# Patient Record
Sex: Male | Born: 1965 | Race: White | Hispanic: No | Marital: Married | Smoking: Current every day smoker
Health system: Southern US, Community
[De-identification: ages and names within clinical notes are randomized; demographics above are authoritative.]

## PROBLEM LIST (undated history)

## (undated) DIAGNOSIS — B182 Chronic viral hepatitis C: Secondary | ICD-10-CM

## (undated) DIAGNOSIS — K7460 Unspecified cirrhosis of liver: Secondary | ICD-10-CM

## (undated) DIAGNOSIS — L409 Psoriasis, unspecified: Secondary | ICD-10-CM

## (undated) DIAGNOSIS — L309 Dermatitis, unspecified: Secondary | ICD-10-CM

## (undated) DIAGNOSIS — F1020 Alcohol dependence, uncomplicated: Secondary | ICD-10-CM

## (undated) DIAGNOSIS — F17200 Nicotine dependence, unspecified, uncomplicated: Secondary | ICD-10-CM

## (undated) DIAGNOSIS — I10 Essential (primary) hypertension: Secondary | ICD-10-CM

---

## 2007-06-15 ENCOUNTER — Emergency Department: Payer: Self-pay | Admitting: Unknown Physician Specialty

## 2014-11-28 ENCOUNTER — Other Ambulatory Visit: Payer: Self-pay | Admitting: Gastroenterology

## 2014-11-28 DIAGNOSIS — B182 Chronic viral hepatitis C: Secondary | ICD-10-CM

## 2014-12-04 ENCOUNTER — Ambulatory Visit: Payer: No Typology Code available for payment source

## 2021-07-20 ENCOUNTER — Inpatient Hospital Stay
Admission: EM | Admit: 2021-07-20 | Discharge: 2021-07-23 | DRG: 433 | Disposition: A | Discharge status: Home / Self Care | Payer: 59 | Attending: Internal Medicine | Admitting: Internal Medicine

## 2021-07-20 ENCOUNTER — Emergency Department: Payer: 59

## 2021-07-20 ENCOUNTER — Other Ambulatory Visit: Payer: Self-pay

## 2021-07-20 DIAGNOSIS — R1900 Intra-abdominal and pelvic swelling, mass and lump, unspecified site: Secondary | ICD-10-CM | POA: Diagnosis not present

## 2021-07-20 DIAGNOSIS — Z6841 Body Mass Index (BMI) 40.0 and over, adult: Secondary | ICD-10-CM

## 2021-07-20 DIAGNOSIS — F1020 Alcohol dependence, uncomplicated: Secondary | ICD-10-CM

## 2021-07-20 DIAGNOSIS — K5900 Constipation, unspecified: Secondary | ICD-10-CM | POA: Diagnosis present

## 2021-07-20 DIAGNOSIS — Z79899 Other long term (current) drug therapy: Secondary | ICD-10-CM

## 2021-07-20 DIAGNOSIS — E669 Obesity, unspecified: Secondary | ICD-10-CM | POA: Diagnosis present

## 2021-07-20 DIAGNOSIS — F17210 Nicotine dependence, cigarettes, uncomplicated: Secondary | ICD-10-CM | POA: Diagnosis present

## 2021-07-20 DIAGNOSIS — R188 Other ascites: Secondary | ICD-10-CM

## 2021-07-20 DIAGNOSIS — I10 Essential (primary) hypertension: Secondary | ICD-10-CM

## 2021-07-20 DIAGNOSIS — Z28310 Unvaccinated for covid-19: Secondary | ICD-10-CM

## 2021-07-20 DIAGNOSIS — K7031 Alcoholic cirrhosis of liver with ascites: Secondary | ICD-10-CM | POA: Diagnosis not present

## 2021-07-20 DIAGNOSIS — Z66 Do not resuscitate: Secondary | ICD-10-CM | POA: Diagnosis present

## 2021-07-20 DIAGNOSIS — K7011 Alcoholic hepatitis with ascites: Secondary | ICD-10-CM | POA: Diagnosis present

## 2021-07-20 DIAGNOSIS — Z8249 Family history of ischemic heart disease and other diseases of the circulatory system: Secondary | ICD-10-CM

## 2021-07-20 DIAGNOSIS — B182 Chronic viral hepatitis C: Secondary | ICD-10-CM

## 2021-07-20 DIAGNOSIS — R7401 Elevation of levels of liver transaminase levels: Secondary | ICD-10-CM | POA: Diagnosis present

## 2021-07-20 DIAGNOSIS — Z20822 Contact with and (suspected) exposure to covid-19: Secondary | ICD-10-CM | POA: Diagnosis present

## 2021-07-20 DIAGNOSIS — E876 Hypokalemia: Secondary | ICD-10-CM | POA: Diagnosis present

## 2021-07-20 HISTORY — DX: Chronic viral hepatitis C: B18.2

## 2021-07-20 HISTORY — DX: Nicotine dependence, unspecified, uncomplicated: F17.200

## 2021-07-20 HISTORY — DX: Alcohol dependence, uncomplicated: F10.20

## 2021-07-20 HISTORY — DX: Essential (primary) hypertension: I10

## 2021-07-20 LAB — MAGNESIUM
Magnesium: 1.9 mg/dL (ref 1.7–2.4)
Magnesium: 1.9 mg/dL (ref 1.7–2.4)

## 2021-07-20 LAB — COMPREHENSIVE METABOLIC PANEL
ALT: 30 U/L (ref 0–44)
AST: 69 U/L — ABNORMAL HIGH (ref 15–41)
Albumin: 2.7 g/dL — ABNORMAL LOW (ref 3.5–5.0)
Alkaline Phosphatase: 139 U/L — ABNORMAL HIGH (ref 38–126)
Anion gap: 6 (ref 5–15)
BUN: 8 mg/dL (ref 6–20)
CO2: 24 mmol/L (ref 22–32)
Calcium: 8.3 mg/dL — ABNORMAL LOW (ref 8.9–10.3)
Chloride: 102 mmol/L (ref 98–111)
Creatinine, Ser: 0.64 mg/dL (ref 0.61–1.24)
GFR, Estimated: >60 mL/min (ref >60)
Glucose, Bld: 121 mg/dL — ABNORMAL HIGH (ref 70–99)
Potassium: 2.7 mmol/L — CL (ref 3.5–5.1)
Sodium: 132 mmol/L — ABNORMAL LOW (ref 135–145)
Total Bilirubin: 1.6 mg/dL — ABNORMAL HIGH (ref 0.3–1.2)
Total Protein: 8.3 g/dL — ABNORMAL HIGH (ref 6.5–8.1)

## 2021-07-20 LAB — CBC
HCT: 39.9 % (ref 39.0–52.0)
Hemoglobin: 13.5 g/dL (ref 13.0–17.0)
MCH: 32.8 pg (ref 26.0–34.0)
MCHC: 33.8 g/dL (ref 30.0–36.0)
MCV: 96.8 fL (ref 80.0–100.0)
Platelets: 193 10*3/uL (ref 150–400)
RBC: 4.12 MIL/uL — ABNORMAL LOW (ref 4.22–5.81)
RDW: 14.4 % (ref 11.5–15.5)
WBC: 8.9 10*3/uL (ref 4.0–10.5)
nRBC: 0 % (ref 0.0–0.2)

## 2021-07-20 LAB — AMMONIA: Ammonia: 65 umol/L — ABNORMAL HIGH (ref 9–35)

## 2021-07-20 LAB — RESP PANEL BY RT-PCR (FLU A&B, COVID) ARPGX2
Influenza A by PCR: NEGATIVE
Influenza B by PCR: NEGATIVE
SARS Coronavirus 2 by RT PCR: NEGATIVE

## 2021-07-20 LAB — LIPASE, BLOOD: Lipase: 57 U/L — ABNORMAL HIGH (ref 11–51)

## 2021-07-20 LAB — PROTIME-INR
INR: 1.1 (ref 0.8–1.2)
Prothrombin Time: 14.7 seconds (ref 11.4–15.2)

## 2021-07-20 LAB — PHOSPHORUS: Phosphorus: 3.4 mg/dL (ref 2.5–4.6)

## 2021-07-20 MED ORDER — ALBUTEROL SULFATE (2.5 MG/3ML) 0.083% IN NEBU: 2.5000 mg | INHALATION_SOLUTION | RESPIRATORY_TRACT | Status: DC | PRN

## 2021-07-20 MED ORDER — POTASSIUM CITRATE-CITRIC ACID 1100-334 MG/5ML PO SOLN
40.0000 meq | Freq: Once | ORAL | Status: AC
  Administered 2021-07-21: 40 meq via ORAL
  Filled 2021-07-20: qty 20

## 2021-07-20 MED ORDER — MORPHINE SULFATE (PF) 4 MG/ML IV SOLN
4.0000 mg | INTRAVENOUS | Status: DC | PRN
  Administered 2021-07-20: 19:00:00 4 mg via INTRAVENOUS
  Filled 2021-07-20: qty 1

## 2021-07-20 MED ORDER — LORAZEPAM 2 MG/ML IJ SOLN: 0.0000 mg | Freq: Two times a day (BID) | INTRAMUSCULAR | Status: DC

## 2021-07-20 MED ORDER — SODIUM CHLORIDE 0.9 % IV SOLN: INTRAVENOUS | Status: DC | PRN

## 2021-07-20 MED ORDER — ESCITALOPRAM OXALATE 10 MG PO TABS
10.0000 mg | ORAL_TABLET | Freq: Every day | ORAL | Status: DC
  Administered 2021-07-21 – 2021-07-23 (×3): 10 mg via ORAL
  Filled 2021-07-20 (×4): qty 1

## 2021-07-20 MED ORDER — LACTULOSE 10 GM/15ML PO SOLN
30.0000 g | Freq: Once | ORAL | Status: AC
  Administered 2021-07-20: 20:00:00 30 g via ORAL
  Filled 2021-07-20: qty 60

## 2021-07-20 MED ORDER — LORAZEPAM 2 MG PO TABS: 0.0000 mg | ORAL_TABLET | Freq: Two times a day (BID) | ORAL | Status: DC

## 2021-07-20 MED ORDER — ACETAMINOPHEN 650 MG RE SUPP
650.0000 mg | Freq: Four times a day (QID) | RECTAL | Status: DC | PRN
  Filled 2021-07-20: qty 1

## 2021-07-20 MED ORDER — THIAMINE HCL 100 MG/ML IJ SOLN
100.0000 mg | Freq: Every day | INTRAMUSCULAR | Status: DC
  Administered 2021-07-20: 18:00:00 100 mg via INTRAVENOUS
  Filled 2021-07-20: qty 2

## 2021-07-20 MED ORDER — MORPHINE SULFATE (PF) 2 MG/ML IV SOLN
2.0000 mg | INTRAVENOUS | Status: AC | PRN
  Administered 2021-07-21 (×3): 2 mg via INTRAVENOUS
  Filled 2021-07-20 (×3): qty 1

## 2021-07-20 MED ORDER — APREMILAST 30 MG PO TABS
30.0000 mg | ORAL_TABLET | Freq: Two times a day (BID) | ORAL | Status: DC
  Administered 2021-07-21 – 2021-07-23 (×4): 30 mg via ORAL
  Filled 2021-07-20 (×5): qty 1

## 2021-07-20 MED ORDER — ALBUMIN HUMAN 25 % IV SOLN
25.0000 g | INTRAVENOUS | Status: DC | PRN
  Filled 2021-07-20: qty 100

## 2021-07-20 MED ORDER — POTASSIUM CHLORIDE 10 MEQ/100ML IV SOLN
10.0000 meq | INTRAVENOUS | Status: AC
  Administered 2021-07-20 (×4): 10 meq via INTRAVENOUS
  Filled 2021-07-20 (×2): qty 100

## 2021-07-20 MED ORDER — LORAZEPAM 2 MG/ML IJ SOLN: 0.0000 mg | Freq: Four times a day (QID) | INTRAMUSCULAR | Status: AC

## 2021-07-20 MED ORDER — ALBUTEROL SULFATE HFA 108 (90 BASE) MCG/ACT IN AERS: 2.0000 | INHALATION_SPRAY | RESPIRATORY_TRACT | Status: DC | PRN

## 2021-07-20 MED ORDER — HEPARIN SODIUM (PORCINE) 5000 UNIT/ML IJ SOLN
5000.0000 [IU] | Freq: Three times a day (TID) | INTRAMUSCULAR | Status: AC
  Administered 2021-07-20: 21:00:00 5000 [IU] via SUBCUTANEOUS
  Filled 2021-07-20: qty 1

## 2021-07-20 MED ORDER — ONDANSETRON HCL 4 MG PO TABS: 4.0000 mg | ORAL_TABLET | Freq: Four times a day (QID) | ORAL | Status: DC | PRN

## 2021-07-20 MED ORDER — ADULT MULTIVITAMIN W/MINERALS CH
1.0000 | ORAL_TABLET | Freq: Every day | ORAL | Status: DC
  Administered 2021-07-20 – 2021-07-23 (×4): 1 via ORAL
  Filled 2021-07-20 (×4): qty 1

## 2021-07-20 MED ORDER — ACETAMINOPHEN 325 MG PO TABS
650.0000 mg | ORAL_TABLET | Freq: Four times a day (QID) | ORAL | Status: DC | PRN
  Administered 2021-07-23: 12:00:00 650 mg via ORAL
  Filled 2021-07-20: qty 2

## 2021-07-20 MED ORDER — THIAMINE HCL 100 MG PO TABS
100.0000 mg | ORAL_TABLET | Freq: Every day | ORAL | Status: DC
  Administered 2021-07-21 – 2021-07-23 (×3): 100 mg via ORAL
  Filled 2021-07-20 (×3): qty 1

## 2021-07-20 MED ORDER — AMLODIPINE BESYLATE 5 MG PO TABS: 10.0000 mg | ORAL_TABLET | Freq: Every day | ORAL | Status: DC

## 2021-07-20 MED ORDER — ONDANSETRON HCL 4 MG/2ML IJ SOLN
4.0000 mg | Freq: Once | INTRAMUSCULAR | Status: AC
  Administered 2021-07-20: 19:00:00 4 mg via INTRAVENOUS
  Filled 2021-07-20: qty 2

## 2021-07-20 MED ORDER — LORAZEPAM 2 MG PO TABS: 0.0000 mg | ORAL_TABLET | Freq: Four times a day (QID) | ORAL | Status: AC

## 2021-07-20 MED ORDER — LABETALOL HCL 5 MG/ML IV SOLN: 5.0000 mg | INTRAVENOUS | Status: AC | PRN

## 2021-07-20 MED ORDER — ONDANSETRON HCL 4 MG/2ML IJ SOLN: 4.0000 mg | Freq: Four times a day (QID) | INTRAMUSCULAR | Status: DC | PRN

## 2021-07-20 NOTE — H&P (Addendum)
°History and Physical  ° °Cameron Shelton MRN:[id number] DOB: 12/16/1965 DOA: 07/20/2021 ° °PCP: Bliss, Laura K, MD °Outpatient Specialists: Dr. Matthew Rein, gastroenterologist, for care everywhere review, last seen in 2016 °Patient coming from: Home ° °I have personally briefly reviewed patient's old medical records in [facility]. ° °Chief Concern: Abdominal pain ° °HPI: Cameron Shelton is a 55 y.o. male with medical history significant for alcohol use disorder, history of chronic hepatitis C, lost to follow-up, obesity, tobacco use disorder, who presents to the emergency department from home for chief concerns of abdominal pain and distention. ° °At bedside Cameron Shelton is able to tell me his name, age, current location and current calendar year.  He does not appear to be in acute distress.  Physical exam was negative for asterixis. ° °Patient reports that the abdominal discomfort and distention has been ongoing and worsening for the last 3 weeks.  He reports that his feet started swelling first.  He reports that his abdomen started having distention over the last 3 weeks.  He states he probably gained approximately 30 pounds.  He denies any fever, nausea, vomiting, chest pain.  He denies any dysuria, diarrhea.  He endorses constipation.  He reports that his last bowel movement was day of admission, and it was hard stool.  He endorses poor appetite and increased constipation from baseline.  He also endorses that his eyes have turned yellow.  He endorses shortness of breath with exertion.  He endorses abdominal generalized pain and discomfort. ° °He denies history of tremors, seizures when he does not have alcohol.  He states he does not drink every day, but nearly every day. ° °Social history: He lives at home with his wife. He smokes cigarettes, 1/2 ppd and at his peak, he smoked 1 ppd. He drinks about 4-5 (12oz) beers per day, for 30 years. 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 ° °Vaccination history: He  is not vaccinated for covid 19.  ° °ROS: °Constitutional: + weight change, no fever °ENT/Mouth: no sore throat, no rhinorrhea °Eyes: no eye pain, no vision changes °Cardiovascular: no chest pain, no dyspnea,  + edema, no palpitations °Respiratory: no cough, no sputum, no wheezing °Gastrointestinal: no nausea, no vomiting, no diarrhea, no constipation °Genitourinary: no urinary incontinence, no dysuria, no hematuria °Musculoskeletal: no arthralgias, no myalgias °Skin: no skin lesions, no pruritus, °Neuro: + weakness, no loss of consciousness, no syncope °Psych: no anxiety, no depression, + decrease appetite °Heme/Lymph: no bruising, no bleeding ° °ED Course: Discussed with emergency medicine provider, patient requiring hospitalization for chief concerns of new onset abdominal ascites presumed secondary to alcohol liver cirrhosis. ° °Vitals in the emergency department showed temperature of 98.1, respiration rate of 19, heart rate of 78, blood pressure 120/80, SPO2 of 99% on room air. ° °Labs in the emergency department was remarkable for serum sodium 132, potassium 2.7, chloride 102, bicarb of 24, BUN of 8, serum creatinine of 0.64, nonfasting blood glucose 121, GFR of 60.  WBC was 8.9, hemoglobin 13.5, platelets 193.  Alk phos was elevated at 139 albumin was low at 2.7. ° °Lipase was mildly elevated at 57.  AST was elevated at 6.9, ALT is 30.  Ammonia was elevated at 65.  T bili was 1.6. ° °COVID/influenza A/influenza B PCR were negative. ° °In the emergency department patient received lactulose 30 g, potassium 10 mill equivalent by IV, 4 doses ordered. ° °Patient was also given morphine 4 mg IV. ° °Assessment/Plan ° °  Principal Problem: °  Ascites °Active Problems: °  Alcoholic cirrhosis of liver with ascites (HCC) °  Chronic hepatitis C without hepatic coma (HCC) °  Alcohol use disorder, severe, dependence (HCC) °  Hypokalemia °  Transaminitis °  Constipation °  Essential hypertension °  °# Abdominal ascites  presumed secondary to new diagnosis of alcoholic liver cirrhosis versus chronic hepatitis C liver cirrhosis °- IR paracentesis ordered with labs °- As needed albumin, 2 doses, ordered for hypotension during paracentesis °- Patient would benefit from regular follow-up with outpatient GI Dr. °- Would recommend patient to reestablish care with Dr. Matthew Rein °- Admit to telemetry medical floor, observation ° °# New diagnosis of cirrhotic liver-status post lactulose 30 g, 1 dose per EDP ° °# Hypokalemia-check magnesium level stat °- Continue IV potassium supplementation °- Potassium Polycitra 40 mill equivalent one-time dose ordered °- Ordered stat EKG ° °# Alcohol use disorder-check phosphorus level stat °- Patient's last drink was half of beer on 07/19/2021 °- Continue CIWA protocol per EDP order °- Extensive counseling given for alcohol cessation °- Patient endorses that he will stop drinking for now ° °# History of hypertension - currently normotensive to low normotensive °- Holding home amlodipine 10 mg daily at this time °- Labetalol 5 mg IV q2h prn for SBP > 170, 1 day ordered °- AM team to resume antihypertensive pending patient clinical course ° °# Chronic hepatitis C-I asked patient why he did not continue follow-up with his gastroenterologist, he had no answer for me he states he just did not go °- Extensive discussion with patient that he will need follow-up with a gastroenterologist in order to prevent reoccurring ascites °- He endorsed to me understanding and compliance ° °# Tobacco use disorder-counseling given °- Nicotine patch was offered, patient declined ° °# DVT prophylaxis-I have ordered 1 dose of heparin 5000 units for DVT prophylaxis °- A.m. team to order more pharmacologic DVT prophylaxis after paracentesis ° °# CODE STATUS-extensive discussion with patient at bedside.  Patient states that he saw his dad go through resuscitation and intubation and he did not want that for himself °- He did not  want chest compression and he did not want intubation. ° °Chart reviewed.  ° °11/27/2014: Patient had initial consultation with gastroenterologist for chronic hepatitis C without hepatic coma.  Liver elastography with ultrasound to rule out cirrhosis was ordered.  He was advised to stop EtOH and tobacco use.  It was noted that the assessment and plan was to apply for 12 weeks of Harvoni.  Patient declined hep a and hep B vaccine. ° °DVT prophylaxis: 1 dose of heparin 5000 units subcutaneous; TED hose °Code Status: DNR/DNI °Diet: Heart healthy °Family Communication: No °Disposition Plan: Pending clinical course °Consults called: None at this time °Admission status: Telemetry medical, observation ° °Past Medical History:  °Diagnosis Date  ° Alcohol use disorder, severe, dependence (HCC)   ° Hepatitis C, chronic (HCC)   ° Hypertension   ° °History reviewed. No pertinent surgical history. ° °Social History:  reports current alcohol use. He reports that he does not currently use drugs. No history on file for tobacco use. ° °No Known Allergies °Family History  °Problem Relation Age of Onset  ° Hypertension Father   ° °Family history: Family history reviewed and not pertinent ° °Prior to Admission medications   °Medication Sig Start Date End Date Taking? Authorizing Provider  °albuterol (VENTOLIN HFA) 108 (90 Base) MCG/ACT inhaler Inhale 2 puffs into the lungs every 4 (  four) hours as needed for shortness of breath or wheezing.   Yes [provider]  amLODipine (NORVASC) 10 MG tablet Take 10 mg by mouth daily.   Yes [provider]  Apremilast (OTEZLA) 30 MG TABS Take 30 mg by mouth 2 (two) times daily.   Yes [provider]  chlorthalidone (HYGROTON) 25 MG tablet Take 25 mg by mouth daily.   Yes [provider]  escitalopram (LEXAPRO) 10 MG tablet Take 10 mg by mouth daily.   Yes [provider]  HYDROcodone-acetaminophen (NORCO) 10-325 MG tablet Take 1 tablet by mouth 4  (four) times daily as needed for pain.   Yes [provider]  Multiple Vitamins-Minerals (MULTIVITAMIN WITH MINERALS) tablet Take 1 tablet by mouth daily.   Yes [provider]  potassium chloride SA (KLOR-CON M) 20 MEQ tablet Take 20 mEq by mouth daily.   Yes [provider]   Physical Exam: Vitals:   07/20/21 1800 07/20/21 1830 07/20/21 1900 07/20/21 1930  BP: 120/80 (!) 146/87 123/78 121/80  Pulse: 76 73 77 77  Resp:  _0 Temp:      TempSrc:      SpO2:  97% 94% 96%  Weight:      Height:       Constitutional: appears older than chronological age, frail, NAD, calm, comfortable Eyes: PERRL, lids and conjunctivae normal ENMT: Mucous membranes are moist. Posterior pharynx clear of any exudate or lesions. Age-appropriate dentition. Hearing appropriate Neck: normal, supple, no masses, no thyromegaly Respiratory: clear to auscultation bilaterally, no wheezing, no crackles. Normal respiratory effort. No accessory muscle use.  Cardiovascular: Regular rate and rhythm, no murmurs / rubs / gallops.  Bilateral lower extremity edema. 2+ pedal pulses. No carotid bruits.  Abdomen: Distended abdomen, + diffuse tenderness, no masses palpated, no hepatosplenomegaly. Bowel sounds positive.  Musculoskeletal: no clubbing / cyanosis. No joint deformity upper and lower extremities. Good ROM, no contractures, no atrophy. Normal muscle tone.  Skin: no rashes, lesions, ulcers. No induration Neurologic: Sensation intact. Strength 5/5 in all 4.  Psychiatric: Normal judgment and insight. Alert and oriented x 3.  Flat affect.  EKG: independently reviewed, showing sinus rhythm, low amplitude, with a rate of 78, QTc 506  Chest x-ray on Admission: I personally reviewed and I agree with radiologist reading as below.  DG Chest Portable 1 View  Result Date: 07/20/2021 CLINICAL DATA:  Edema. EXAM: PORTABLE CHEST 1 VIEW COMPARISON:  None. FINDINGS: The heart size and mediastinal  contours are within normal limits. Both lungs are clear. The visualized skeletal structures are unremarkable. IMPRESSION: No active disease. Electronically Signed   By: Dorise Bullion III M.D.   On: 07/20/2021 18:15   US ABDOMEN LIMITED RUQ (LIVER/GB)  Result Date: 07/20/2021 CLINICAL DATA:  Upper abdominal pain and nausea for 2 weeks. EXAM: ULTRASOUND ABDOMEN LIMITED RIGHT UPPER QUADRANT COMPARISON:  None. FINDINGS: Gallbladder: No gallstones visualized. There is mild gallbladder wall thickening measuring 0.4 cm. Negative sonographic Murphy sign. Common bile duct: Diameter: 0.6 cm, within normal limits Liver: The liver is nodular and shrunken. No focal lesion identified.Portal vein is patent on color Doppler imaging with normal direction of blood flow towards the liver. Other: Moderate 4 quadrant ascites. IMPRESSION: 1.  Cirrhotic appearance of the liver.  No focal lesion identified. 2. Nonspecific mild gallbladder wall thickening. No gallstones identified. 3.  Moderate 4 quadrant ascites. Electronically Signed   By: Audie Pinto M.D.   On: 07/20/2021 18:57  Labs on Admission: I have personally reviewed following labs ° °CBC: °Recent Labs  °Lab 07/20/21 °1744  °WBC 8.9  °HGB 13.5  °HCT 39.9  °MCV 96.8  °PLT 193  ° °Basic Metabolic Panel: °Recent Labs  °Lab 07/20/21 °1744  °NA 132*  °K 2.7*  °CL 102  °CO2 24  °GLUCOSE 121*  °BUN 8  °CREATININE 0.64  °CALCIUM 8.3*  °MG 1.9  ° °GFR: °Estimated Creatinine Clearance: 120.5 mL/min (by C-G formula based on SCr of 0.64 mg/dL). ° °Liver Function Tests: °Recent Labs  °Lab 07/20/21 °1744  °AST 69*  °ALT 30  °ALKPHOS 139*  °BILITOT 1.6*  °PROT 8.3*  °ALBUMIN 2.7*  ° °Recent Labs  °Lab 07/20/21 °1744  °LIPASE 57*  ° °Recent Labs  °Lab 07/20/21 °1744  °AMMONIA 65*  ° °Coagulation Profile: °Recent Labs  °Lab 07/20/21 °1744  °INR 1.1  ° °Dr. Cox °[facility] ° °If 7PM-7AM, please contact overnight-coverage provider °If 7AM-7PM, please contact day coverage  provider °[email or web address] ° °07/20/2021, 9:34 PM  ° °

## 2021-07-20 NOTE — ED Triage Notes (Signed)
Pt states he has has upper abdominal pain for 2 weeks- pt has had nausea with dry heaving, no appetite, more constipated than normal, and increased fatigue- pt abdomen distended and pt states it feels tight- pt eyes are yellow

## 2021-07-20 NOTE — ED Notes (Signed)
Pt is c/o of general abdominal pain that has been ongoing for a couple a weeks. Pt also c/o of nausea with no vomiting and decrease in appetite. Pt states he drinks 3 times daily. Pain level is 8.

## 2021-07-20 NOTE — ED Provider Notes (Signed)
[facility] Emergency Department Provider Note    Event Date/Time   First MD Initiated Contact with Patient 07/20/21 1719     (approximate)  I have reviewed the triage vital signs and the nursing notes.   HISTORY  Chief Complaint Abdominal Pain    HPI Cameron Shelton is a 55 y.o. male history of alcohol use as well as reported elevated liver enzymes but no formal diagnosis of cirrhosis presents to the ER for evaluation of abdominal pain distention and swelling.  This is been going on for several weeks but become significantly more noticeable in the past 24 hours.  Denies any shortness of breath.  Has not been able to eat or drink due to discomfort.  Having trouble moving his bowels as well.  Past Medical History:  Diagnosis Date   Hypertension    No family history on file.  There are no problems to display for this patient.     Prior to Admission medications   Not on File    Allergies Patient has no known allergies.    Social History    Review of Systems Patient denies headaches, rhinorrhea, blurry vision, numbness, shortness of breath, chest pain, edema, cough, abdominal pain, nausea, vomiting, diarrhea, dysuria, fevers, rashes or hallucinations unless otherwise stated above in HPI. ____________________________________________   PHYSICAL EXAM:  VITAL SIGNS: Vitals:   07/20/21 1800 07/20/21 1830  BP: 120/80 (!) 146/87  Pulse: 76 73  Resp:  18  Temp:    SpO2:  97%    Constitutional: Alert and oriented.  Eyes: Conjunctivae are normal.  Head: Atraumatic. Nose: No congestion/rhinnorhea. Mouth/Throat: Mucous membranes are moist.   Neck: No stridor. Painless ROM.  Cardiovascular: Normal rate, regular rhythm. Grossly normal heart sounds.  Good peripheral circulation. Respiratory: Normal respiratory effort.  No retractions. Lungs CTAB. Gastrointestinal: Diffusely distended with positive fluid wave.  No guarding or rebound  tenderness   Genitourinary:  Musculoskeletal: No lower extremity tenderness, 2+ BLE edema.  No joint effusions. Neurologic:  Normal speech and language. No gross focal neurologic deficits are appreciated. No facial droop Skin:  Skin is warm, dry and intact. No rash noted. Psychiatric: Mood and affect are normal. Speech and behavior are normal.  ____________________________________________   LABS (all labs ordered are listed, but only abnormal results are displayed)  Results for orders placed or performed during the hospital encounter of 07/20/21 (from the past 24 hour(s))  CBC     Status: Abnormal   Collection Time: 07/20/21  5:44 PM  Result Value Ref Range   WBC 8.9 4.0 - 10.5 K/uL   RBC 4.12 (L) 4.22 - 5.81 MIL/uL   Hemoglobin 13.5 13.0 - 17.0 g/dL   HCT 69.6 29.5 - 28.4 %   MCV 96.8 80.0 - 100.0 fL   MCH 32.8 26.0 - 34.0 pg   MCHC 33.8 30.0 - 36.0 g/dL   RDW 13.2 44.0 - 10.2 %   Platelets 193 150 - 400 K/uL   nRBC 0.0 0.0 - 0.2 %  Comprehensive metabolic panel     Status: Abnormal   Collection Time: 07/20/21  5:44 PM  Result Value Ref Range   Sodium 132 (L) 135 - 145 mmol/L   Potassium 2.7 (LL) 3.5 - 5.1 mmol/L   Chloride 102 98 - 111 mmol/L   CO2 24 22 - 32 mmol/L   Glucose, Bld 121 (H) 70 - 99 mg/dL   BUN 8 6 - 20 mg/dL   Creatinine, Ser 7.25 0.61 -  1.24 mg/dL   Calcium 8.3 (L) 8.9 - 10.3 mg/dL   Total Protein 8.3 (H) 6.5 - 8.1 g/dL   Albumin 2.7 (L) 3.5 - 5.0 g/dL   AST 69 (H) 15 - 41 U/L   ALT 30 0 - 44 U/L   Alkaline Phosphatase 139 (H) 38 - 126 U/L   Total Bilirubin 1.6 (H) 0.3 - 1.2 mg/dL   GFR, Estimated >29 >92 mL/min   Anion gap 6 5 - 15  Protime-INR     Status: None   Collection Time: 07/20/21  5:44 PM  Result Value Ref Range   Prothrombin Time 14.7 11.4 - 15.2 seconds   INR 1.1 0.8 - 1.2  Lipase, blood     Status: Abnormal   Collection Time: 07/20/21  5:44 PM  Result Value Ref Range   Lipase 57 (H) 11 - 51 U/L  Ammonia     Status: Abnormal    Collection Time: 07/20/21  5:44 PM  Result Value Ref Range   Ammonia 65 (H) 9 - 35 umol/L  Resp Panel by RT-PCR (Flu A&B, Covid) Nasopharyngeal Swab     Status: None   Collection Time: 07/20/21  5:44 PM   Specimen: Nasopharyngeal Swab; Nasopharyngeal(NP) swabs in vial transport medium  Result Value Ref Range   SARS Coronavirus 2 by RT PCR NEGATIVE NEGATIVE   Influenza A by PCR NEGATIVE NEGATIVE   Influenza B by PCR NEGATIVE NEGATIVE   ____________________________________________  EKG____________________________________________  RADIOLOGY  I personally reviewed all radiographic images ordered to evaluate for the above acute complaints and reviewed radiology reports and findings.  These findings were personally discussed with the patient.  Please see medical record for radiology report.  ____________________________________________   PROCEDURES  Procedure(s) performed:  Procedures    Critical Care performed: no ____________________________________________   INITIAL IMPRESSION / ASSESSMENT AND PLAN / ED COURSE  Pertinent labs & imaging results that were available during my care of the patient were reviewed by me and considered in my medical decision making (see chart for details).   DDX: ascites, sbo, chf, anasarca, cirrhosis, mass,   Cameron Shelton is a 55 y.o. who presents to the ED with symptoms as described above.  Patient nontoxic-appearing but with significantly distended abdomen and findings consistent with anasarca.  Patient with evidence of acute hypokalemia as well as hyperammonemia but he is mentating well with no asterixis.  Presentation is concerning for liver failure with cirrhosis with new ascites.  Have a low suspicion for SBP given lack of white count pain or fever.  Do think he will need large-volume paracentesis however given his hypokalemia as this is a new diagnosis of cirrhosis will discuss with hospitalist for admission.     The patient was evaluated  in Emergency Department today for the symptoms described in the history of present illness. He/she was evaluated in the context of the global COVID-19 pandemic, which necessitated consideration that the patient might be at risk for infection with the SARS-CoV-2 virus that causes COVID-19. Institutional protocols and algorithms that pertain to the evaluation of patients at risk for COVID-19 are in a state of rapid change based on information released by regulatory bodies including the CDC and federal and state organizations. These policies and algorithms were followed during the patient's care in the ED.  As part of my medical decision making, I reviewed the following data within the electronic MEDICAL RECORD NUMBER Nursing notes reviewed and incorporated, Labs reviewed, notes from prior ED visits and [place]  Controlled Substance Database   ____________________________________________   FINAL CLINICAL IMPRESSION(S) / ED DIAGNOSES  Final diagnoses:  Swelling abdomen  Ascites due to alcoholic cirrhosis (HCC)      NEW MEDICATIONS STARTED DURING THIS VISIT:  New Prescriptions   No medications on file     Note:  This document was prepared using Dragon voice recognition software and may include unintentional dictation errors.    Willy Eddy, MD 07/20/21 [phone]

## 2021-07-21 ENCOUNTER — Encounter: Payer: Self-pay | Admitting: Internal Medicine

## 2021-07-21 ENCOUNTER — Observation Stay: Payer: 59

## 2021-07-21 DIAGNOSIS — K7011 Alcoholic hepatitis with ascites: Secondary | ICD-10-CM | POA: Diagnosis present

## 2021-07-21 DIAGNOSIS — E876 Hypokalemia: Secondary | ICD-10-CM | POA: Diagnosis present

## 2021-07-21 DIAGNOSIS — Z6841 Body Mass Index (BMI) 40.0 and over, adult: Secondary | ICD-10-CM | POA: Diagnosis not present

## 2021-07-21 DIAGNOSIS — B182 Chronic viral hepatitis C: Secondary | ICD-10-CM | POA: Diagnosis present

## 2021-07-21 DIAGNOSIS — Z8249 Family history of ischemic heart disease and other diseases of the circulatory system: Secondary | ICD-10-CM | POA: Diagnosis not present

## 2021-07-21 DIAGNOSIS — K5900 Constipation, unspecified: Secondary | ICD-10-CM | POA: Diagnosis present

## 2021-07-21 DIAGNOSIS — Z20822 Contact with and (suspected) exposure to covid-19: Secondary | ICD-10-CM | POA: Diagnosis present

## 2021-07-21 DIAGNOSIS — Z28310 Unvaccinated for covid-19: Secondary | ICD-10-CM | POA: Diagnosis not present

## 2021-07-21 DIAGNOSIS — I10 Essential (primary) hypertension: Secondary | ICD-10-CM | POA: Diagnosis present

## 2021-07-21 DIAGNOSIS — K7031 Alcoholic cirrhosis of liver with ascites: Secondary | ICD-10-CM | POA: Diagnosis present

## 2021-07-21 DIAGNOSIS — E669 Obesity, unspecified: Secondary | ICD-10-CM | POA: Diagnosis present

## 2021-07-21 DIAGNOSIS — R188 Other ascites: Secondary | ICD-10-CM | POA: Diagnosis not present

## 2021-07-21 DIAGNOSIS — F17210 Nicotine dependence, cigarettes, uncomplicated: Secondary | ICD-10-CM | POA: Diagnosis present

## 2021-07-21 DIAGNOSIS — R1900 Intra-abdominal and pelvic swelling, mass and lump, unspecified site: Secondary | ICD-10-CM | POA: Diagnosis present

## 2021-07-21 DIAGNOSIS — Z66 Do not resuscitate: Secondary | ICD-10-CM | POA: Diagnosis present

## 2021-07-21 DIAGNOSIS — Z79899 Other long term (current) drug therapy: Secondary | ICD-10-CM | POA: Diagnosis not present

## 2021-07-21 DIAGNOSIS — F1020 Alcohol dependence, uncomplicated: Secondary | ICD-10-CM | POA: Diagnosis present

## 2021-07-21 LAB — CBC
HCT: 34 % — ABNORMAL LOW (ref 39.0–52.0)
Hemoglobin: 11.4 g/dL — ABNORMAL LOW (ref 13.0–17.0)
MCH: 32.6 pg (ref 26.0–34.0)
MCHC: 33.5 g/dL (ref 30.0–36.0)
MCV: 97.1 fL (ref 80.0–100.0)
Platelets: 159 10*3/uL (ref 150–400)
RBC: 3.5 MIL/uL — ABNORMAL LOW (ref 4.22–5.81)
RDW: 14.6 % (ref 11.5–15.5)
WBC: 8.5 10*3/uL (ref 4.0–10.5)
nRBC: 0 % (ref 0.0–0.2)

## 2021-07-21 LAB — BASIC METABOLIC PANEL
Anion gap: 3 — ABNORMAL LOW (ref 5–15)
BUN: 8 mg/dL (ref 6–20)
CO2: 24 mmol/L (ref 22–32)
Calcium: 7.7 mg/dL — ABNORMAL LOW (ref 8.9–10.3)
Chloride: 106 mmol/L (ref 98–111)
Creatinine, Ser: 0.54 mg/dL — ABNORMAL LOW (ref 0.61–1.24)
GFR, Estimated: >60 mL/min (ref >60)
Glucose, Bld: 105 mg/dL — ABNORMAL HIGH (ref 70–99)
Potassium: 3.2 mmol/L — ABNORMAL LOW (ref 3.5–5.1)
Sodium: 133 mmol/L — ABNORMAL LOW (ref 135–145)

## 2021-07-21 LAB — BODY FLUID CELL COUNT WITH DIFFERENTIAL
Eos, Fluid: 0 %
Lymphs, Fluid: 20 %
Monocyte-Macrophage-Serous Fluid: 47 %
Neutrophil Count, Fluid: 33 %
Other Cells, Fluid: 0 %
Total Nucleated Cell Count, Fluid: 358 cu mm

## 2021-07-21 LAB — PROTEIN, PLEURAL OR PERITONEAL FLUID: Total protein, fluid: <3 g/dL

## 2021-07-21 LAB — HIV ANTIBODY (ROUTINE TESTING W REFLEX): HIV Screen 4th Generation wRfx: NONREACTIVE

## 2021-07-21 LAB — LACTATE DEHYDROGENASE, PLEURAL OR PERITONEAL FLUID: LD, Fluid: 46 U/L — ABNORMAL HIGH (ref 3–23)

## 2021-07-21 MED ORDER — SPIRONOLACTONE 25 MG PO TABS
50.0000 mg | ORAL_TABLET | Freq: Every day | ORAL | Status: DC
Start: 2021-07-21 — End: 2021-07-23
  Administered 2021-07-21 – 2021-07-22 (×2): 50 mg via ORAL
  Filled 2021-07-21 (×2): qty 2

## 2021-07-21 MED ORDER — RIFAXIMIN 550 MG PO TABS
550.0000 mg | ORAL_TABLET | Freq: Two times a day (BID) | ORAL | Status: DC
  Administered 2021-07-21 – 2021-07-23 (×5): 550 mg via ORAL
  Filled 2021-07-21 (×6): qty 1

## 2021-07-21 MED ORDER — POTASSIUM CHLORIDE CRYS ER 20 MEQ PO TBCR
40.0000 meq | EXTENDED_RELEASE_TABLET | ORAL | Status: AC
  Administered 2021-07-21: 11:00:00 40 meq via ORAL
  Filled 2021-07-21: qty 2

## 2021-07-21 MED ORDER — ALBUMIN HUMAN 25 % IV SOLN
25.0000 g | Freq: Once | INTRAVENOUS | Status: AC
  Administered 2021-07-21: 11:00:00 25 g via INTRAVENOUS
  Filled 2021-07-21: qty 100

## 2021-07-21 MED ORDER — FUROSEMIDE 40 MG PO TABS
40.0000 mg | ORAL_TABLET | Freq: Every day | ORAL | Status: DC
  Administered 2021-07-21 – 2021-07-23 (×3): 40 mg via ORAL
  Filled 2021-07-21 (×3): qty 1

## 2021-07-21 MED ORDER — MORPHINE SULFATE (PF) 2 MG/ML IV SOLN
2.0000 mg | INTRAVENOUS | Status: AC | PRN
  Administered 2021-07-21 – 2021-07-22 (×3): 2 mg via INTRAVENOUS
  Filled 2021-07-21 (×3): qty 1

## 2021-07-21 NOTE — ED Notes (Signed)
Report received from Dorian RN. Patient care assumed. Patient/RN introduction complete. Will continue to monitor.  °

## 2021-07-21 NOTE — Procedures (Signed)
PROCEDURE SUMMARY:  Successful US guided paracentesis from RLQ.  Yielded 5 Liters of clear yellow fluid.  No immediate complications.  Pt tolerated well.   Specimen was sent for labs.  EBL None.  Pattricia Boss D PA-C 07/21/2021 9:51 AM

## 2021-07-21 NOTE — ED Notes (Signed)
Patient transported to Ultrasound 

## 2021-07-21 NOTE — ED Notes (Signed)
Pt placed in hospital bed with no issue. Pt tolerated well.

## 2021-07-21 NOTE — ED Notes (Signed)
Pt watching TV awaiting paracentesis this am. Rates abd pain 8/10 at this time.

## 2021-07-21 NOTE — Progress Notes (Signed)
PROGRESS NOTE    Cameron Shelton  ZOX:[id number] DOB: 1965-11-03 DOA: 07/20/2021 PCP: Lynnell Jude, MD   Brief Narrative:  HPI: Cameron Shelton is a 55 y.o. male with medical history significant for alcohol use disorder, history of chronic hepatitis C, lost to follow-up, obesity, tobacco use disorder, who presents to the emergency department from home for chief concerns of abdominal pain and distention.   At bedside [name] is able to tell me his name, age, current location and current calendar year.  He does not appear to be in acute distress.  Physical exam was negative for asterixis.   Patient reports that the abdominal discomfort and distention has been ongoing and worsening for the last 3 weeks.  He reports that his feet started swelling first.  He reports that his abdomen started having distention over the last 3 weeks.  He states he probably gained approximately 30 pounds.  He denies any fever, nausea, vomiting, chest pain.  He denies any dysuria, diarrhea.  He endorses constipation.  He reports that his last bowel movement was day of admission, and it was hard stool.  He endorses poor appetite and increased constipation from baseline.  He also endorses that his eyes have turned yellow.  He endorses shortness of breath with exertion.  He endorses abdominal generalized pain and discomfort.   ED Course: Discussed with emergency medicine provider, patient requiring hospitalization for chief concerns of new onset abdominal ascites presumed secondary to alcohol liver cirrhosis.  Vitals in the emergency department showed temperature of 98.1, respiration rate of 19, heart rate of 78, blood pressure 120/80, SPO2 of 99% on room air.  Labs in the emergency department was remarkable for serum sodium 132, potassium 2.7, chloride 102, bicarb of 24, BUN of 8, serum creatinine of 0.64, nonfasting blood glucose 121, GFR of 60.  WBC was 8.9, hemoglobin 13.5, platelets 193.  Alk phos was elevated  at 139 albumin was low at 2.7.  Lipase was mildly elevated at 57.  AST was elevated at 6.9, ALT is 30.  Ammonia was elevated at 65.  T bili was 1.6.   COVID/influenza A/influenza B PCR were negative.  In the emergency department patient received lactulose 30 g, potassium 10 mill equivalent by IV, 4 doses ordered.   Patient was also given morphine 4 mg IV.  Assessment & Plan:   Principal Problem:   Ascites Active Problems:   Alcoholic cirrhosis of liver with ascites (HCC)   Chronic hepatitis C without hepatic coma (HCC)   Alcohol use disorder, severe, dependence (HCC)   Hypokalemia   Transaminitis   Constipation   Essential hypertension  Abdominal ascites secondary to new diagnosis of alcoholic liver cirrhosis in the setting of untreated chronic hepatitis C: Does not have any more abdominal pain.  S/p paracentesis with a yield of 5 L of fluid.  We will give him IV albumin.  So far fluid analysis argues against SBP.  No indication of antibiotics.  We will also start him on Xifaxan, Lasix and Aldactone.  Will need to establish care with GI as outpatient.  Hypokalemia: Still low, will replace again.   Alcohol abuse/dependency: Patient states that he used to drink 6-8 beers per day and he did that for several years however since last 2 weeks, he is not liking the taste so he is down to only 1-2 beers a day.  He does not have any signs of withdrawal and doubt that he will have any.  We will  continue CIWA with as needed Ativan.   History of hypertension: Blood pressure on the low side, continue to hold home dose of amlodipine.  Would likely need to discontinue that altogether now that he is going to be on Lasix and Aldactone.  Tobacco use disorder: Counseling provided to  DVT prophylaxis: Place TED hose Start: 07/20/21 2023   Code Status: DNR  Family Communication: Wife present at bedside.  Plan of care discussed with patient in length and he verbalized understanding and agreed with  it.  Status is: Observation  The patient will require care spanning > 2 midnights and should be moved to inpatient because: Needs monitoring for recurrence of ascites and has hypokalemia  Estimated body mass index is 40.35 kg/m as calculated from the following:   Height as of this encounter: _0  (1.676 m).   Weight as of this encounter: 113.4 kg.  Nutritional Assessment: Body mass index is 40.35 kg/m.Marland Kitchen Seen by dietician.  I agree with the assessment and plan as outlined below: Nutrition Status:   Skin Assessment: I have examined the patient's skin and I agree with the wound assessment as performed by the wound care RN as outlined below:  Consultants:  None  Procedures:  Paracentesis  Antimicrobials:  Anti-infectives (From admission, onward)    Start     Dose/Rate Route Frequency Ordered Stop   07/21/21 1000  rifaximin (XIFAXAN) tablet 550 mg        550 mg Oral 2 times daily 07/21/21 0951            Subjective: Seen and examined after paracentesis, wife at the bedside.  Patient has no complaints.  Objective: Vitals:   07/21/21 0911 07/21/21 1000 07/21/21 1100 07/21/21 1130  BP: 111/67 107/63 105/71 105/71  Pulse:  69 75 66  Resp:  15 19   Temp:      TempSrc:      SpO2: 96% 99% 97%   Weight:      Height:        Intake/Output Summary (Last 24 hours) at 07/21/2021 1251 Last data filed at 07/20/2021 2152 Gross per 24 hour  Intake 200 ml  Output --  Net 200 ml   Filed Weights   07/20/21 1721  Weight: 113.4 kg    Examination:  General exam: Appears calm and comfortable, obese Respiratory system: Clear to auscultation. Respiratory effort normal. Cardiovascular system: S1 & S2 heard, RRR. No JVD, murmurs, rubs, gallops or clicks. No pedal edema. Gastrointestinal system: Abdomen is slightly distended, soft and nontender. No organomegaly or masses felt. Normal bowel sounds heard.  Central nervous system: Alert and oriented. No focal neurological  deficits. Extremities: Symmetric 5 x 5 power. Skin: No rashes, lesions or ulcers Psychiatry: Judgement and insight appear normal. Mood & affect appropriate.    Data Reviewed: I have personally reviewed following labs and imaging studies  CBC: Recent Labs  Lab 07/20/21 1744 07/21/21 0500  WBC 8.9 8.5  HGB 13.5 11.4*  HCT 39.9 34.0*  MCV 96.8 97.1  PLT 193 767   Basic Metabolic Panel: Recent Labs  Lab 07/20/21 1744 07/20/21 2118 07/21/21 0500  NA 132*  --  133*  K 2.7*  --  3.2*  CL 102  --  106  CO2 24  --  24  GLUCOSE 121*  --  105*  BUN 8  --  8  CREATININE 0.64  --  0.54*  CALCIUM 8.3*  --  7.7*  MG 1.9 1.9  --  PHOS  --  3.4  --    GFR: Estimated Creatinine Clearance: 120.5 mL/min (A) (by C-G formula based on SCr of 0.54 mg/dL (L)). Liver Function Tests: Recent Labs  Lab 07/20/21 1744  AST 69*  ALT 30  ALKPHOS 139*  BILITOT 1.6*  PROT 8.3*  ALBUMIN 2.7*   Recent Labs  Lab 07/20/21 1744  LIPASE 57*   Recent Labs  Lab 07/20/21 1744  AMMONIA 65*   Coagulation Profile: Recent Labs  Lab 07/20/21 1744  INR 1.1   Cardiac Enzymes: No results for input(s): CKTOTAL, CKMB, CKMBINDEX, TROPONINI in the last 168 hours. BNP (last 3 results) No results for input(s): PROBNP in the last 8760 hours. HbA1C: No results for input(s): HGBA1C in the last 72 hours. CBG: No results for input(s): GLUCAP in the last 168 hours. Lipid Profile: No results for input(s): CHOL, HDL, LDLCALC, TRIG, CHOLHDL, LDLDIRECT in the last 72 hours. Thyroid Function Tests: No results for input(s): TSH, T4TOTAL, FREET4, T3FREE, THYROIDAB in the last 72 hours. Anemia Panel: No results for input(s): VITAMINB12, FOLATE, FERRITIN, TIBC, IRON, RETICCTPCT in the last 72 hours. Sepsis Labs: No results for input(s): PROCALCITON, LATICACIDVEN in the last 168 hours.  Recent Results (from the past 240 hour(s))  Resp Panel by RT-PCR (Flu A&B, Covid) Nasopharyngeal Swab     Status: None    Collection Time: 07/20/21  5:44 PM   Specimen: Nasopharyngeal Swab; Nasopharyngeal(NP) swabs in vial transport medium  Result Value Ref Range Status   SARS Coronavirus 2 by RT PCR NEGATIVE NEGATIVE Final    Comment: (NOTE) SARS-CoV-2 target nucleic acids are NOT DETECTED.  The SARS-CoV-2 RNA is generally detectable in upper respiratory specimens during the acute phase of infection. The lowest concentration of SARS-CoV-2 viral copies this assay can detect is 138 copies/mL. A negative result does not preclude SARS-Cov-2 infection and should not be used as the sole basis for treatment or other patient management decisions. A negative result may occur with  improper specimen collection/handling, submission of specimen other than nasopharyngeal swab, presence of viral mutation(s) within the areas targeted by this assay, and inadequate number of viral copies(<138 copies/mL). A negative result must be combined with clinical observations, patient history, and epidemiological information. The expected result is Negative.  Fact Sheet for Patients:  EntrepreneurPulse.com.au  Fact Sheet for Healthcare Providers:  IncredibleEmployment.be  This test is no t yet approved or cleared by the [place] FDA and  has been authorized for detection and/or diagnosis of SARS-CoV-2 by FDA under an Emergency Use Authorization (EUA). This EUA will remain  in effect (meaning this test can be used) for the duration of the COVID-19 declaration under Section 564(b)(1) of the Act, 21 U.S.C.section 360bbb-3(b)(1), unless the authorization is terminated  or revoked sooner.       Influenza A by PCR NEGATIVE NEGATIVE Final   Influenza B by PCR NEGATIVE NEGATIVE Final    Comment: (NOTE) The Xpert Xpress SARS-CoV-2/FLU/RSV plus assay is intended as an aid in the diagnosis of influenza from Nasopharyngeal swab specimens and should not be used as a sole basis for treatment.  Nasal washings and aspirates are unacceptable for Xpert Xpress SARS-CoV-2/FLU/RSV testing.  Fact Sheet for Patients: EntrepreneurPulse.com.au  Fact Sheet for Healthcare Providers: IncredibleEmployment.be  This test is not yet approved or cleared by the [place] FDA and has been authorized for detection and/or diagnosis of SARS-CoV-2 by FDA under an Emergency Use Authorization (EUA). This EUA will remain in effect (meaning this test can  be used) for the duration of the COVID-19 declaration under Section 564(b)(1) of the Act, 21 U.S.C. section 360bbb-3(b)(1), unless the authorization is terminated or revoked.  Performed at [facility], [address] [place], [place] [address]       Radiology Studies: DG Chest Portable 1 View  Result Date: 07/20/2021 CLINICAL DATA:  Edema. EXAM: PORTABLE CHEST 1 VIEW COMPARISON:  None. FINDINGS: The heart size and mediastinal contours are within normal limits. Both lungs are clear. The visualized skeletal structures are unremarkable. IMPRESSION: No active disease. Electronically Signed   By: Dorise Bullion III M.D.   On: 07/20/2021 18:15   US ABDOMEN LIMITED RUQ (LIVER/GB)  Result Date: 07/20/2021 CLINICAL DATA:  Upper abdominal pain and nausea for 2 weeks. EXAM: ULTRASOUND ABDOMEN LIMITED RIGHT UPPER QUADRANT COMPARISON:  None. FINDINGS: Gallbladder: No gallstones visualized. There is mild gallbladder wall thickening measuring 0.4 cm. Negative sonographic Murphy sign. Common bile duct: Diameter: 0.6 cm, within normal limits Liver: The liver is nodular and shrunken. No focal lesion identified.Portal vein is patent on color Doppler imaging with normal direction of blood flow towards the liver. Other: Moderate 4 quadrant ascites. IMPRESSION: 1.  Cirrhotic appearance of the liver.  No focal lesion identified. 2. Nonspecific mild gallbladder wall thickening. No gallstones identified. 3.  Moderate 4  quadrant ascites. Electronically Signed   By: Audie Pinto M.D.   On: 07/20/2021 18:57    Scheduled Meds:  Apremilast  30 mg Oral BID   escitalopram  10 mg Oral Daily   furosemide  40 mg Oral Daily   LORazepam  0-4 mg Intravenous Q6H   Or   LORazepam  0-4 mg Oral Q6H   [START ON 07/23/2021] LORazepam  0-4 mg Intravenous Q12H   Or   [START ON 07/23/2021] LORazepam  0-4 mg Oral Q12H   multivitamin with minerals  1 tablet Oral Daily   potassium chloride  40 mEq Oral Q4H   rifaximin  550 mg Oral BID   spironolactone  50 mg Oral Daily   thiamine  100 mg Oral Daily   Or   thiamine  100 mg Intravenous Daily   Continuous Infusions:  sodium chloride 10 mL/hr at 07/21/21 0740   albumin human       LOS: 0 days   Time spent: 33 minutes   Darliss Cheney, MD [facility]  07/21/2021, 12:51 PM  Please page via She[facility] and do not message via secure chat for anything urgent. Secure chat can be used for anything non urgent.  How to contact the [facility] Attending or Consulting provider [place] or covering provider during after hours [place], for this patient?  Check the care team in [facility] and look for a) attending/consulting [facility] provider listed and b) the [facility] team listed. Page or secure chat [phone]. Log into [email or web address] and use [facility]'s universal password to access. If you do not have the password, please contact the hospital operator. Locate the [facility] provider you are looking for under [facility] and page to a number that you can be directly reached. If you still have difficulty reaching the provider, please page the [facility] (Director on Call) for the Hospitalists listed on amion for assistance.

## 2021-07-21 NOTE — ED Notes (Signed)
Pt has stomach pain  pain meds given /  pt alert, watching tv.    Family with pt

## 2021-07-21 NOTE — ED Notes (Signed)
Report off to sara rn

## 2021-07-22 ENCOUNTER — Inpatient Hospital Stay: Payer: 59

## 2021-07-22 DIAGNOSIS — K7011 Alcoholic hepatitis with ascites: Secondary | ICD-10-CM

## 2021-07-22 DIAGNOSIS — F1020 Alcohol dependence, uncomplicated: Secondary | ICD-10-CM

## 2021-07-22 LAB — CBC WITH DIFFERENTIAL/PLATELET
Abs Immature Granulocytes: 0.03 10*3/uL (ref 0.00–0.07)
Basophils Absolute: 0.1 10*3/uL (ref 0.0–0.1)
Basophils Relative: 2 %
Eosinophils Absolute: 0.2 10*3/uL (ref 0.0–0.5)
Eosinophils Relative: 2 %
HCT: 33.5 % — ABNORMAL LOW (ref 39.0–52.0)
Hemoglobin: 11.5 g/dL — ABNORMAL LOW (ref 13.0–17.0)
Immature Granulocytes: 0 %
Lymphocytes Relative: 27 %
Lymphs Abs: 1.8 10*3/uL (ref 0.7–4.0)
MCH: 33.2 pg (ref 26.0–34.0)
MCHC: 34.3 g/dL (ref 30.0–36.0)
MCV: 96.8 fL (ref 80.0–100.0)
Monocytes Absolute: 0.8 10*3/uL (ref 0.1–1.0)
Monocytes Relative: 12 %
Neutro Abs: 3.9 10*3/uL (ref 1.7–7.7)
Neutrophils Relative %: 57 %
Platelets: 143 10*3/uL — ABNORMAL LOW (ref 150–400)
RBC: 3.46 MIL/uL — ABNORMAL LOW (ref 4.22–5.81)
RDW: 14.3 % (ref 11.5–15.5)
WBC: 6.8 10*3/uL (ref 4.0–10.5)
nRBC: 0 % (ref 0.0–0.2)

## 2021-07-22 LAB — COMPREHENSIVE METABOLIC PANEL
ALT: 21 U/L (ref 0–44)
AST: 52 U/L — ABNORMAL HIGH (ref 15–41)
Albumin: 2 g/dL — ABNORMAL LOW (ref 3.5–5.0)
Alkaline Phosphatase: 95 U/L (ref 38–126)
Anion gap: 5 (ref 5–15)
BUN: 7 mg/dL (ref 6–20)
CO2: 26 mmol/L (ref 22–32)
Calcium: 7.7 mg/dL — ABNORMAL LOW (ref 8.9–10.3)
Chloride: 104 mmol/L (ref 98–111)
Creatinine, Ser: 0.57 mg/dL — ABNORMAL LOW (ref 0.61–1.24)
GFR, Estimated: >60 mL/min (ref >60)
Glucose, Bld: 109 mg/dL — ABNORMAL HIGH (ref 70–99)
Potassium: 2.8 mmol/L — ABNORMAL LOW (ref 3.5–5.1)
Sodium: 135 mmol/L (ref 135–145)
Total Bilirubin: 0.7 mg/dL (ref 0.3–1.2)
Total Protein: 5.6 g/dL — ABNORMAL LOW (ref 6.5–8.1)

## 2021-07-22 LAB — PROTEIN, BODY FLUID (OTHER): Total Protein, Body Fluid Other: 1.6 g/dL

## 2021-07-22 LAB — CYTOLOGY - NON PAP

## 2021-07-22 MED ORDER — POTASSIUM CHLORIDE 20 MEQ PO PACK
40.0000 meq | PACK | ORAL | Status: AC
  Administered 2021-07-22 (×3): 40 meq via ORAL
  Filled 2021-07-22 (×3): qty 2

## 2021-07-22 MED ORDER — MORPHINE SULFATE (PF) 2 MG/ML IV SOLN
2.0000 mg | INTRAVENOUS | Status: AC | PRN
  Administered 2021-07-22 – 2021-07-23 (×3): 2 mg via INTRAVENOUS
  Filled 2021-07-22 (×3): qty 1

## 2021-07-22 NOTE — Progress Notes (Signed)
PROGRESS NOTE    Cameron Shelton  KZL:[id number] DOB: [birth year] DOA: 07/20/2021 PCP: Lynnell Jude, MD   Brief Narrative:  HPI: Cameron Shelton is a 55 y.o. male with medical history significant for alcohol use disorder, history of chronic hepatitis C, lost to follow-up, obesity, tobacco use disorder, who presents to the emergency department from home for chief concerns of abdominal pain and distention.   At bedside Cameron Shelton is able to tell me his name, age, current location and current calendar year.  He does not appear to be in acute distress.  Physical exam was negative for asterixis.   Patient reports that the abdominal discomfort and distention has been ongoing and worsening for the last 3 weeks.  He reports that his feet started swelling first.  He reports that his abdomen started having distention over the last 3 weeks.  He states he probably gained approximately 30 pounds.  He denies any fever, nausea, vomiting, chest pain.  He denies any dysuria, diarrhea.  He endorses constipation.  He reports that his last bowel movement was day of admission, and it was hard stool.  He endorses poor appetite and increased constipation from baseline.  He also endorses that his eyes have turned yellow.  He endorses shortness of breath with exertion.  He endorses abdominal generalized pain and discomfort.   ED Course: Discussed with emergency medicine provider, patient requiring hospitalization for chief concerns of new onset abdominal ascites presumed secondary to alcohol liver cirrhosis.  Vitals in the emergency department showed temperature of 98.1, respiration rate of 19, heart rate of 78, blood pressure 120/80, SPO2 of 99% on room air.  Labs in the emergency department was remarkable for serum sodium 132, potassium 2.7, chloride 102, bicarb of 24, BUN of 8, serum creatinine of 0.64, nonfasting blood glucose 121, GFR of 60.  WBC was 8.9, hemoglobin 13.5, platelets 193.  Alk phos was elevated  at 139 albumin was low at 2.7.  Lipase was mildly elevated at 57.  AST was elevated at 6.9, ALT is 30.  Ammonia was elevated at 65.  T bili was 1.6.   COVID/influenza A/influenza B PCR were negative.  In the emergency department patient received lactulose 30 g, potassium 10 mill equivalent by IV, 4 doses ordered.   Patient was also given morphine 4 mg IV.  Assessment & Plan:   Principal Problem:   Ascites Active Problems:   Alcoholic cirrhosis of liver with ascites (HCC)   Chronic hepatitis C without hepatic coma (HCC)   Alcohol use disorder, severe, dependence (HCC)   Hypokalemia   Transaminitis   Constipation   Essential hypertension   Ascites due to chronic alcoholic hepatitis  Abdominal ascites secondary to new diagnosis of alcoholic liver cirrhosis in the setting of untreated chronic hepatitis C: Does not have any more abdominal pain.  S/p paracentesis with a yield of 5 L of fluid yesterday, given IV albumin yesterday. So far fluid analysis argues against SBP.  No indication of antibiotics.  I started him on Xifaxan, Lasix and Aldactone.  We will continue that.  However he has significantly more distended abdomen with positive ascites.  Will need another paracentesis.  I have placed an order for IR guided paracentesis.  Will need to establish care with GI as outpatient.  Hypokalemia: Severely low.  Will replace aggressively.  Monitor closely.   Alcohol abuse/dependency: Patient states that he used to drink 6-8 beers per day and he did that for several years however  since last 2 weeks, he is not liking the taste so he is down to only 1-2 beers a day.  He does not have any signs of withdrawal and doubt that he will have any.  We will continue CIWA with as needed Ativan.   History of hypertension: Blood pressure very well controlled, continue to hold home dose of amlodipine.  Would likely need to discontinue that altogether now that he is going to be on Lasix and Aldactone.  Tobacco  use disorder: Counseling provided to  DVT prophylaxis: Place TED hose Start: 07/20/21 2023   Code Status: DNR  Family Communication: None present at bedside.  Plan of care discussed with patient in length and he verbalized understanding and agreed with it.  Status is: Inpatient  Remains inpatient appropriate because: Needs another paracentesis for recurrent ascites.  Estimated body mass index is 40.35 kg/m as calculated from the following:   Height as of this encounter: 5' 6" (1.676 m).   Weight as of this encounter: 113.4 kg.  Nutritional Assessment: Body mass index is 40.35 kg/m.Marland Kitchen Seen by dietician.  I agree with the assessment and plan as outlined below: Nutrition Status:   Skin Assessment: I have examined the patient's skin and I agree with the wound assessment as performed by the wound care RN as outlined below:  Consultants:  None  Procedures:  Paracentesis  Antimicrobials:  Anti-infectives (From admission, onward)    Start     Dose/Rate Route Frequency Ordered Stop   07/21/21 1000  rifaximin (XIFAXAN) tablet 550 mg        550 mg Oral 2 times daily 07/21/21 0951            Subjective:  Seen and examined.  He has no complaints.  He is wanting to go home.  I explained to him that he has reaccumulating ascites again and that he needs paracentesis.  He did not opposed to having that and staying overnight.  Objective: Vitals:   07/21/21 1933 07/21/21 2100 07/22/21 0024 07/22/21 0459  BP: 99/71 104/74 103/70 120/66  Pulse: 70 70 67 69  Resp: _0 Temp:  98.4 F (36.9 C) 98.1 F (36.7 C) 98.6 F (37 C)  TempSrc: Oral Oral Oral Oral  SpO2: 95% 95% 98% 97%  Weight:      Height:       No intake or output data in the 24 hours ending 07/22/21 0746  Filed Weights   07/20/21 1721  Weight: 113.4 kg    Examination:  General exam: Appears calm and comfortable, obese Respiratory system: Clear to auscultation. Respiratory effort normal. Cardiovascular  system: S1 & S2 heard, RRR. No JVD, murmurs, rubs, gallops or clicks. No pedal edema. Gastrointestinal system: Abdomen is moderate to severe distended with positive fluid shift, soft and nontender. No organomegaly or masses felt. Normal bowel sounds heard. Central nervous system: Alert and oriented. No focal neurological deficits. Extremities: Symmetric 5 x 5 power. Skin: No rashes, lesions or ulcers.  Psychiatry: Judgement and insight appear normal. Mood & affect appropriate.    Data Reviewed: I have personally reviewed following labs and imaging studies  CBC: Recent Labs  Lab 07/20/21 1744 07/21/21 0500 07/22/21 0426  WBC 8.9 8.5 6.8  NEUTROABS  --   --  3.9  HGB 13.5 11.4* 11.5*  HCT 39.9 34.0* 33.5*  MCV 96.8 97.1 96.8  PLT 193 159 143*    Basic Metabolic Panel: Recent Labs  Lab 07/20/21 1744 07/20/21 2118 07/21/21  0500 07/22/21 0426  NA 132*  --  133* 135  K 2.7*  --  3.2* 2.8*  CL 102  --  106 104  CO2 24  --  24 26  GLUCOSE 121*  --  105* 109*  BUN 8  --  8 7  CREATININE 0.64  --  0.54* 0.57*  CALCIUM 8.3*  --  7.7* 7.7*  MG 1.9 1.9  --   --   PHOS  --  3.4  --   --     GFR: Estimated Creatinine Clearance: 120.5 mL/min (A) (by C-G formula based on SCr of 0.57 mg/dL (L)). Liver Function Tests: Recent Labs  Lab 07/20/21 1744 07/22/21 0426  AST 69* 52*  ALT 30 21  ALKPHOS 139* 95  BILITOT 1.6* 0.7  PROT 8.3* 5.6*  ALBUMIN 2.7* 2.0*    Recent Labs  Lab 07/20/21 1744  LIPASE 57*    Recent Labs  Lab 07/20/21 1744  AMMONIA 65*    Coagulation Profile: Recent Labs  Lab 07/20/21 1744  INR 1.1    Cardiac Enzymes: No results for input(s): CKTOTAL, CKMB, CKMBINDEX, TROPONINI in the last 168 hours. BNP (last 3 results) No results for input(s): PROBNP in the last 8760 hours. HbA1C: No results for input(s): HGBA1C in the last 72 hours. CBG: No results for input(s): GLUCAP in the last 168 hours. Lipid Profile: No results for input(s): CHOL,  HDL, LDLCALC, TRIG, CHOLHDL, LDLDIRECT in the last 72 hours. Thyroid Function Tests: No results for input(s): TSH, T4TOTAL, FREET4, T3FREE, THYROIDAB in the last 72 hours. Anemia Panel: No results for input(s): VITAMINB12, FOLATE, FERRITIN, TIBC, IRON, RETICCTPCT in the last 72 hours. Sepsis Labs: No results for input(s): PROCALCITON, LATICACIDVEN in the last 168 hours.  Recent Results (from the past 240 hour(s))  Resp Panel by RT-PCR (Flu A&B, Covid) Nasopharyngeal Swab     Status: None   Collection Time: 07/20/21  5:44 PM   Specimen: Nasopharyngeal Swab; Nasopharyngeal(NP) swabs in vial transport medium  Result Value Ref Range Status   SARS Coronavirus 2 by RT PCR NEGATIVE NEGATIVE Final    Comment: (NOTE) SARS-CoV-2 target nucleic acids are NOT DETECTED.  The SARS-CoV-2 RNA is generally detectable in upper respiratory specimens during the acute phase of infection. The lowest concentration of SARS-CoV-2 viral copies this assay can detect is 138 copies/mL. A negative result does not preclude SARS-Cov-2 infection and should not be used as the sole basis for treatment or other patient management decisions. A negative result may occur with  improper specimen collection/handling, submission of specimen other than nasopharyngeal swab, presence of viral mutation(s) within the areas targeted by this assay, and inadequate number of viral copies(<138 copies/mL). A negative result must be combined with clinical observations, patient history, and epidemiological information. The expected result is Negative.  Fact Sheet for Patients:  EntrepreneurPulse.com.au  Fact Sheet for Healthcare Providers:  IncredibleEmployment.be  This test is no t yet approved or cleared by the [place] FDA and  has been authorized for detection and/or diagnosis of SARS-CoV-2 by FDA under an Emergency Use Authorization (EUA). This EUA will remain  in effect (meaning this  test can be used) for the duration of the COVID-19 declaration under Section 564(b)(1) of the Act, 21 U.S.C.section 360bbb-3(b)(1), unless the authorization is terminated  or revoked sooner.       Influenza A by PCR NEGATIVE NEGATIVE Final   Influenza B by PCR NEGATIVE NEGATIVE Final    Comment: (NOTE) The Xpert Xpress  SARS-CoV-2/FLU/RSV plus assay is intended as an aid in the diagnosis of influenza from Nasopharyngeal swab specimens and should not be used as a sole basis for treatment. Nasal washings and aspirates are unacceptable for Xpert Xpress SARS-CoV-2/FLU/RSV testing.  Fact Sheet for Patients: EntrepreneurPulse.com.au  Fact Sheet for Healthcare Providers: IncredibleEmployment.be  This test is not yet approved or cleared by the [place] FDA and has been authorized for detection and/or diagnosis of SARS-CoV-2 by FDA under an Emergency Use Authorization (EUA). This EUA will remain in effect (meaning this test can be used) for the duration of the COVID-19 declaration under Section 564(b)(1) of the Act, 21 U.S.C. section 360bbb-3(b)(1), unless the authorization is terminated or revoked.  Performed at [facility], [address] [place], [place] [address]   Body fluid culture w Gram Stain     Status: None (Preliminary result)   Collection Time: 07/21/21  8:35 AM   Specimen: PATH Cytology Peritoneal fluid  Result Value Ref Range Status   Specimen Description   Final    PERITONEAL Performed at [facility], [address] [place], [place] [address]    Special Requests   Final    NONE Performed at [facility], [address] [place], [place] [address]    Gram Stain   Final    WBC PRESENT,BOTH PMN AND MONONUCLEAR NO ORGANISMS SEEN CYTOSPIN SMEAR Performed at [facility], [place] [address], [place],  [address]    Culture PENDING  Incomplete   Report Status PENDING  Incomplete        Radiology Studies: US Paracentesis  Result Date: 07/21/2021 INDICATION: Cirrhosis with ascites request received for diagnostic and therapeutic paracentesis. EXAM: ULTRASOUND GUIDED  PARACENTESIS MEDICATIONS: Local 1% lidocaine only. COMPLICATIONS: None immediate. PROCEDURE: Informed written consent was obtained from the patient after a discussion of the risks, benefits and alternatives to treatment. A timeout was performed prior to the initiation of the procedure. Initial ultrasound scanning demonstrates a large amount of ascites within the right lower abdominal quadrant. The right lower abdomen was prepped and draped in the usual sterile fashion. 1% lidocaine was used for local anesthesia. Following this, a 19 gauge, 7-cm, Yueh catheter was introduced. An ultrasound image was saved for documentation purposes. The paracentesis was performed. The catheter was removed and a dressing was applied. The patient tolerated the procedure well without immediate post procedural complication. FINDINGS: A total of approximately 5 L of yellow-colored fluid was removed. Samples were sent to the laboratory as requested by the clinical team. IMPRESSION: Successful ultrasound-guided paracentesis yielding 5 liters of peritoneal fluid. This exam was performed by Tsosie Billing PA-C, and was supervised and interpreted by Dr. Denna Haggard. Electronically Signed   By: Albin Felling M.D.   On: 07/21/2021 14:24   DG Chest Portable 1 View  Result Date: 07/20/2021 CLINICAL DATA:  Edema. EXAM: PORTABLE CHEST 1 VIEW COMPARISON:  None. FINDINGS: The heart size and mediastinal contours are within normal limits. Both lungs are clear. The visualized skeletal structures are unremarkable. IMPRESSION: No active disease. Electronically Signed   By: Dorise Bullion III M.D.   On: 07/20/2021 18:15   US ABDOMEN LIMITED RUQ (LIVER/GB)  Result Date: 07/20/2021 CLINICAL DATA:  Upper abdominal pain and nausea for 2 weeks. EXAM: ULTRASOUND  ABDOMEN LIMITED RIGHT UPPER QUADRANT COMPARISON:  None. FINDINGS: Gallbladder: No gallstones visualized. There is mild gallbladder wall thickening measuring 0.4 cm. Negative sonographic Murphy sign. Common bile duct: Diameter: 0.6 cm, within normal limits Liver:  The liver is nodular and shrunken. No focal lesion identified.Portal vein is patent on color Doppler imaging with normal direction of blood flow towards the liver. Other: Moderate 4 quadrant ascites. IMPRESSION: 1.  Cirrhotic appearance of the liver.  No focal lesion identified. 2. Nonspecific mild gallbladder wall thickening. No gallstones identified. 3.  Moderate 4 quadrant ascites. Electronically Signed   By: Audie Pinto M.D.   On: 07/20/2021 18:57    Scheduled Meds:  Apremilast  30 mg Oral BID   escitalopram  10 mg Oral Daily   furosemide  40 mg Oral Daily   LORazepam  0-4 mg Intravenous Q6H   Or   LORazepam  0-4 mg Oral Q6H   [START ON 07/23/2021] LORazepam  0-4 mg Intravenous Q12H   Or   [START ON 07/23/2021] LORazepam  0-4 mg Oral Q12H   multivitamin with minerals  1 tablet Oral Daily   potassium chloride  40 mEq Oral Q4H   rifaximin  550 mg Oral BID   spironolactone  50 mg Oral Daily   thiamine  100 mg Oral Daily   Or   thiamine  100 mg Intravenous Daily   Continuous Infusions:  sodium chloride Stopped (07/21/21 2337)   albumin human       LOS: 1 day   Time spent: 28 minutes   Darliss Cheney, MD [facility]  07/22/2021, 7:46 AM  Please page via Amion and do not message via secure chat for anything urgent. Secure chat can be used for anything non urgent.  How to contact the [facility] Attending or Consulting provider [place] or covering provider during after hours [place], for this patient?  Check the care team in [facility] and look for a) attending/consulting TR[facility] provider listed and b) the [facility] team listed. Page or secure chat [phone]. Log into [email or web address] and use [place]'s universal password to access. If you do  not have the password, please contact the hospital operator. Locate the [facility] provider you are looking for under [facility] and page to a number that you can be directly reached. If you still have difficulty reaching the provider, please page the [facility] (Director on Call) for the Hospitalists listed on amion for assistance.

## 2021-07-22 NOTE — Plan of Care (Signed)

## 2021-07-22 NOTE — Procedures (Signed)
PROCEDURE SUMMARY:  Successful US guided paracentesis from RLQ.  Yielded 5 L of clear yellow fluid.  No immediate complications.  Pt tolerated well.   Specimen was not sent for labs.  EBL < 51mL  Cloretta Ned 07/22/2021 3:50 PM

## 2021-07-22 NOTE — TOC Initial Note (Signed)
Transition of Care [facility]) - Initial/Assessment Note    Patient Details  Name: Cameron Shelton MRN: [id number] Date of Birth: [birth year]  [facility] of [facility]) CM/SW Contact:    Chapman Fitch, RN Phone Number: 07/22/2021, 10:48 AM  Clinical Narrative:                  Transition of Care [facility]) Screening Note   Patient Details  Name: Cameron Shelton Date of Birth: 05-27-1966   Transition of Care [facility]) CM/SW Contact:    Chapman Fitch, RN Phone Number: 07/22/2021, 10:48 AM    Transition of Care [facility]) has reviewed patient and no TOC needs have been identified at this time. We will continue to monitor patient advancement through interdisciplinary progression rounds. If new patient transition needs arise, please place a TOC consult.          Patient Goals and CMS Choice        Expected Discharge Plan and Services                                                Prior Living Arrangements/Services                       Activities of Daily Living Home Assistive Devices/Equipment: None ADL Screening (condition at time of admission) Patient's cognitive ability adequate to safely complete daily activities?: Yes Is the patient deaf or have difficulty hearing?: No Does the patient have difficulty seeing, even when wearing glasses/contacts?: No Does the patient have difficulty concentrating, remembering, or making decisions?: No Patient able to express need for assistance with ADLs?: No Does the patient have difficulty dressing or bathing?: No Independently performs ADLs?: Yes (appropriate for developmental age) Does the patient have difficulty walking or climbing stairs?: No Weakness of Legs: None Weakness of Arms/Hands: None  Permission Sought/Granted                  Emotional Assessment              Admission diagnosis:  Swelling abdomen [R19.00] Fluid in peritoneal cavity [R18.8] Ascites [R18.8] Ascites  due to alcoholic cirrhosis (HCC) [K70.31] Ascites due to chronic alcoholic hepatitis [K70.11] Patient Active Problem List   Diagnosis Date Noted   Ascites due to chronic alcoholic hepatitis 07/21/2021   Ascites 07/20/2021   Alcoholic cirrhosis of liver with ascites (HCC) 07/20/2021   Chronic hepatitis C without hepatic coma (HCC) 07/20/2021   Alcohol use disorder, severe, dependence (HCC) 07/20/2021   Hypokalemia 07/20/2021   Transaminitis 07/20/2021   Constipation 07/20/2021   Essential hypertension 07/20/2021   PCP:  Dortha Kern, MD Pharmacy:   [facility] [place], [place] - [address] [place] [address] Phone: [phone] Fax: [phone]     Social Determinants of Health (SDOH) Interventions    Readmission Risk Interventions No flowsheet data found.

## 2021-07-23 ENCOUNTER — Inpatient Hospital Stay: Payer: 59

## 2021-07-23 DIAGNOSIS — E876 Hypokalemia: Secondary | ICD-10-CM

## 2021-07-23 DIAGNOSIS — K7031 Alcoholic cirrhosis of liver with ascites: Principal | ICD-10-CM

## 2021-07-23 LAB — CBC
HCT: 35.1 % — ABNORMAL LOW (ref 39.0–52.0)
Hemoglobin: 11.8 g/dL — ABNORMAL LOW (ref 13.0–17.0)
MCH: 32.2 pg (ref 26.0–34.0)
MCHC: 33.6 g/dL (ref 30.0–36.0)
MCV: 95.9 fL (ref 80.0–100.0)
Platelets: 146 10*3/uL — ABNORMAL LOW (ref 150–400)
RBC: 3.66 MIL/uL — ABNORMAL LOW (ref 4.22–5.81)
RDW: 14.2 % (ref 11.5–15.5)
WBC: 7.1 10*3/uL (ref 4.0–10.5)
nRBC: 0 % (ref 0.0–0.2)

## 2021-07-23 LAB — COMPREHENSIVE METABOLIC PANEL
ALT: 21 U/L (ref 0–44)
AST: 49 U/L — ABNORMAL HIGH (ref 15–41)
Albumin: 2 g/dL — ABNORMAL LOW (ref 3.5–5.0)
Alkaline Phosphatase: 96 U/L (ref 38–126)
Anion gap: 3 — ABNORMAL LOW (ref 5–15)
BUN: 8 mg/dL (ref 6–20)
CO2: 25 mmol/L (ref 22–32)
Calcium: 7.6 mg/dL — ABNORMAL LOW (ref 8.9–10.3)
Chloride: 104 mmol/L (ref 98–111)
Creatinine, Ser: 0.51 mg/dL — ABNORMAL LOW (ref 0.61–1.24)
GFR, Estimated: >60 mL/min (ref >60)
Glucose, Bld: 93 mg/dL (ref 70–99)
Potassium: 3.1 mmol/L — ABNORMAL LOW (ref 3.5–5.1)
Sodium: 132 mmol/L — ABNORMAL LOW (ref 135–145)
Total Bilirubin: 1.1 mg/dL (ref 0.3–1.2)
Total Protein: 5.5 g/dL — ABNORMAL LOW (ref 6.5–8.1)

## 2021-07-23 MED ORDER — SPIRONOLACTONE 25 MG PO TABS
100.0000 mg | ORAL_TABLET | Freq: Every day | ORAL | Status: DC
  Administered 2021-07-23: 11:00:00 100 mg via ORAL
  Filled 2021-07-23: qty 4

## 2021-07-23 MED ORDER — SPIRONOLACTONE 100 MG PO TABS: 100.0000 mg | ORAL_TABLET | Freq: Every day | ORAL | 0 refills | Status: DC

## 2021-07-23 MED ORDER — FUROSEMIDE 40 MG PO TABS: 40.0000 mg | ORAL_TABLET | Freq: Every day | ORAL | 0 refills | Status: DC

## 2021-07-23 MED ORDER — POTASSIUM CHLORIDE 20 MEQ PO PACK
40.0000 meq | PACK | ORAL | Status: AC
  Administered 2021-07-23 (×2): 40 meq via ORAL
  Filled 2021-07-23 (×2): qty 2

## 2021-07-23 MED ORDER — MORPHINE SULFATE (PF) 2 MG/ML IV SOLN
1.0000 mg | Freq: Once | INTRAVENOUS | Status: AC
  Administered 2021-07-23: 07:00:00 1 mg via INTRAVENOUS
  Filled 2021-07-23: qty 1

## 2021-07-23 NOTE — Discharge Summary (Addendum)
Physician Discharge Summary  Cameron Shelton NTI:[id number] DOB: 07-Aug-1965 DOA: 07/20/2021  PCP: Dortha Kern, MD  Admit date: 07/20/2021 Discharge date: 07/23/2021  Discharge disposition: Home   Recommendations for Outpatient Follow-Up:   Follow-up with PCP in 1 week Outpatient follow-up with gastroenterologist was recommended.   Discharge Diagnosis:   Principal Problem:   Ascites Active Problems:   Alcoholic cirrhosis of liver with ascites (HCC)   Chronic hepatitis C without hepatic coma (HCC)   Alcohol use disorder, severe, dependence (HCC)   Hypokalemia   Transaminitis   Constipation   Essential hypertension   Ascites due to chronic alcoholic hepatitis    Discharge Condition: Stable.  Diet recommendation:  Diet Order             Diet - low sodium heart healthy           Diet Heart Room service appropriate? Yes; Fluid consistency: Thin  Diet effective now                     Code Status: DNR     Hospital Course:   [name] is a 55 year old man with medical history significant for alcohol use disorder, chronic hepatitis C, obesity, tobacco use disorder, who presented to the hospital because of abdominal pain and abdominal distention of about 3 weeks duration.  He also noticed bilateral leg swelling during this period  and he reported about a 30 pound weight gain and exertional shortness of breath.  He was found to have alcoholic liver cirrhosis complicated by ascites.  He underwent paracentesis on 2 consecutive days with removal of 5 L of ascitic fluid on both occasions.  There was no evidence of SBP.  He was started on Lasix and Aldactone for ascites.  He had hypokalemia that was repleted.  His condition has improved and he is deemed stable for discharge to home.  He has been advised to avoid alcohol and cigarettes.  Discharge plan was discussed with the patient and his wife (over the phone).      Discharge Exam:    Vitals:    07/22/21 1555 07/22/21 2015 07/23/21 0455 07/23/21 0842  BP: 111/66 109/69 108/65 114/78  Pulse: 68 68 67 64  Resp: 18 20 18 18   Temp: 98.3 F (36.8 C) 98.2 F (36.8 C) 98.4 F (36.9 C) 98.1 F (36.7 C)  TempSrc: Oral Oral Oral   SpO2: 99% 97% 99% 98%  Weight:      Height:         GEN: NAD SKIN: No rash EYES: EOMI ENT: MMM CV: RRR PULM: CTA B ABD: soft, grossly distended, NT, +BS CNS: AAO x 3, non focal EXT: No edema or tenderness   The results of significant diagnostics from this hospitalization (including imaging, microbiology, ancillary and laboratory) are listed below for reference.     Procedures and Diagnostic Studies:   Paracentesis  Result Date: 07/21/2021 INDICATION: Cirrhosis with ascites request received for diagnostic and therapeutic paracentesis. EXAM: ULTRASOUND GUIDED  PARACENTESIS MEDICATIONS: Local 1% lidocaine only. COMPLICATIONS: None immediate. PROCEDURE: Informed written consent was obtained from the patient after a discussion of the risks, benefits and alternatives to treatment. A timeout was performed prior to the initiation of the procedure. Initial ultrasound scanning demonstrates a large amount of ascites within the right lower abdominal quadrant. The right lower abdomen was prepped and draped in the usual sterile fashion. 1% lidocaine was used for local anesthesia. Following this, a 19 gauge,  7-cm, Yueh catheter was introduced. An ultrasound image was saved for documentation purposes. The paracentesis was performed. The catheter was removed and a dressing was applied. The patient tolerated the procedure well without immediate post procedural complication. FINDINGS: A total of approximately 5 L of yellow-colored fluid was removed. Samples were sent to the laboratory as requested by the clinical team. IMPRESSION: Successful ultrasound-guided paracentesis yielding 5 liters of peritoneal fluid. This exam was performed by Pattricia Boss PA-C, and was  supervised and interpreted by Dr. Juliette Alcide. Electronically Signed   By: Olive Bass M.D.   On: 07/21/2021 14:24   DG Chest Portable 1 View  Result Date: 07/20/2021 CLINICAL DATA:  Edema. EXAM: PORTABLE CHEST 1 VIEW COMPARISON:  None. FINDINGS: The heart size and mediastinal contours are within normal limits. Both lungs are clear. The visualized skeletal structures are unremarkable. IMPRESSION: No active disease. Electronically Signed   By: Gerome Sam III M.D.   On: 07/20/2021 18:15   US ABDOMEN LIMITED RUQ (LIVER/GB)  Result Date: 07/20/2021 CLINICAL DATA:  Upper abdominal pain and nausea for 2 weeks. EXAM: ULTRASOUND ABDOMEN LIMITED RIGHT UPPER QUADRANT COMPARISON:  None. FINDINGS: Gallbladder: No gallstones visualized. There is mild gallbladder wall thickening measuring 0.4 cm. Negative sonographic Murphy sign. Common bile duct: Diameter: 0.6 cm, within normal limits Liver: The liver is nodular and shrunken. No focal lesion identified.Portal vein is patent on color Doppler imaging with normal direction of blood flow towards the liver. Other: Moderate 4 quadrant ascites. IMPRESSION: 1.  Cirrhotic appearance of the liver.  No focal lesion identified. 2. Nonspecific mild gallbladder wall thickening. No gallstones identified. 3.  Moderate 4 quadrant ascites. Electronically Signed   By: Emmaline Kluver M.D.   On: 07/20/2021 18:57     Labs:   Basic Metabolic Panel: Recent Labs  Lab 07/20/21 1744 07/20/21 2118 07/21/21 0500 07/22/21 0426 07/23/21 0532  NA 132*  --  133* 135 132*  K 2.7*  --  3.2* 2.8* 3.1*  CL 102  --  106 104 104  CO2 24  --  24 26 25   GLUCOSE 121*  --  105* 109* 93  BUN 8  --  8 7 8   CREATININE 0.64  --  0.54* 0.57* 0.51*  CALCIUM 8.3*  --  7.7* 7.7* 7.6*  MG 1.9 1.9  --   --   --   PHOS  --  3.4  --   --   --    GFR Estimated Creatinine Clearance: 120.5 mL/min (A) (by C-G formula based on SCr of 0.51 mg/dL (L)). Liver Function Tests: Recent Labs  Lab  07/20/21 1744 07/22/21 0426 07/23/21 0532  AST 69* 52* 49*  ALT 30 21 21   ALKPHOS 139* 95 96  BILITOT 1.6* 0.7 1.1  PROT 8.3* 5.6* 5.5*  ALBUMIN 2.7* 2.0* 2.0*   Recent Labs  Lab 07/20/21 1744  LIPASE 57*   Recent Labs  Lab 07/20/21 1744  AMMONIA 65*   Coagulation profile Recent Labs  Lab 07/20/21 1744  INR 1.1    CBC: Recent Labs  Lab 07/20/21 1744 07/21/21 0500 07/22/21 0426 07/23/21 0532  WBC 8.9 8.5 6.8 7.1  NEUTROABS  --   --  3.9  --   HGB 13.5 11.4* 11.5* 11.8*  HCT 39.9 34.0* 33.5* 35.1*  MCV 96.8 97.1 96.8 95.9  PLT 193 159 143* 146*   Cardiac Enzymes: No results for input(s): CKTOTAL, CKMB, CKMBINDEX, TROPONINI in the last 168 hours. BNP: Invalid input(s): POCBNP CBG:  No results for input(s): GLUCAP in the last 168 hours. D-Dimer No results for input(s): DDIMER in the last 72 hours. Hgb A1c No results for input(s): HGBA1C in the last 72 hours. Lipid Profile No results for input(s): CHOL, HDL, LDLCALC, TRIG, CHOLHDL, LDLDIRECT in the last 72 hours. Thyroid function studies No results for input(s): TSH, T4TOTAL, T3FREE, THYROIDAB in the last 72 hours.  Invalid input(s): FREET3 Anemia work up No results for input(s): VITAMINB12, FOLATE, FERRITIN, TIBC, IRON, RETICCTPCT in the last 72 hours. Microbiology Recent Results (from the past 240 hour(s))  Resp Panel by RT-PCR (Flu A&B, Covid) Nasopharyngeal Swab     Status: None   Collection Time: 07/20/21  5:44 PM   Specimen: Nasopharyngeal Swab; Nasopharyngeal(NP) swabs in vial transport medium  Result Value Ref Range Status   SARS Coronavirus 2 by RT PCR NEGATIVE NEGATIVE Final    Comment: (NOTE) SARS-CoV-2 target nucleic acids are NOT DETECTED.  The SARS-CoV-2 RNA is generally detectable in upper respiratory specimens during the acute phase of infection. The lowest concentration of SARS-CoV-2 viral copies this assay can detect is 138 copies/mL. A negative result does not preclude  SARS-Cov-2 infection and should not be used as the sole basis for treatment or other patient management decisions. A negative result may occur with  improper specimen collection/handling, submission of specimen other than nasopharyngeal swab, presence of viral mutation(s) within the areas targeted by this assay, and inadequate number of viral copies(<138 copies/mL). A negative result must be combined with clinical observations, patient history, and epidemiological information. The expected result is Negative.  Fact Sheet for Patients:  BloggerCourse.com  Fact Sheet for Healthcare Providers:  SeriousBroker.it  This test is no t yet approved or cleared by the [place] FDA and  has been authorized for detection and/or diagnosis of SARS-CoV-2 by FDA under an Emergency Use Authorization (EUA). This EUA will remain  in effect (meaning this test can be used) for the duration of the COVID-19 declaration under Section 564(b)(1) of the Act, 21 U.S.C.section 360bbb-3(b)(1), unless the authorization is terminated  or revoked sooner.       Influenza A by PCR NEGATIVE NEGATIVE Final   Influenza B by PCR NEGATIVE NEGATIVE Final    Comment: (NOTE) The Xpert Xpress SARS-CoV-2/FLU/RSV plus assay is intended as an aid in the diagnosis of influenza from Nasopharyngeal swab specimens and should not be used as a sole basis for treatment. Nasal washings and aspirates are unacceptable for Xpert Xpress SARS-CoV-2/FLU/RSV testing.  Fact Sheet for Patients: BloggerCourse.com  Fact Sheet for Healthcare Providers: SeriousBroker.it  This test is not yet approved or cleared by the [place] FDA and has been authorized for detection and/or diagnosis of SARS-CoV-2 by FDA under an Emergency Use Authorization (EUA). This EUA will remain in effect (meaning this test can be used) for the duration of  the COVID-19 declaration under Section 564(b)(1) of the Act, 21 U.S.C. section 360bbb-3(b)(1), unless the authorization is terminated or revoked.  Performed at [facility], [address] [place], [place] [address]   Body fluid culture w Gram Stain     Status: None (Preliminary result)   Collection Time: 07/21/21  8:35 AM   Specimen: PATH Cytology Peritoneal fluid  Result Value Ref Range Status   Specimen Description   Final    PERITONEAL Performed at [facility], [address] [place], [place] [address]    Special Requests   Final    NONE Performed at [facility], [address] [place], [place] [address]    Gram Stain   Final    WBC PRESENT,BOTH PMN AND MONONUCLEAR NO ORGANISMS SEEN CYTOSPIN SMEAR    Culture   Final    NO GROWTH 2 DAYS Performed at [facility], [address], [place], [place] [address]    Report Status PENDING  Incomplete     Discharge Instructions:   Discharge Instructions     Diet - low sodium heart healthy   Complete by: As directed    Increase activity slowly   Complete by: As directed       Allergies as of 07/23/2021   No Known Allergies      Medication List     STOP taking these medications    chlorthalidone 25 MG tablet Commonly known as: HYGROTON       TAKE these medications    albuterol 108 (90 Base) MCG/ACT inhaler Commonly known as: VENTOLIN HFA Inhale 2 puffs into the lungs every 4 (four) hours as needed for shortness of breath or wheezing.   amLODipine 10 MG tablet Commonly known as: NORVASC Take 10 mg by mouth daily.   escitalopram 10 MG tablet Commonly known as: LEXAPRO Take 10 mg by mouth daily.   furosemide 40 MG tablet Commonly known as: LASIX Take 1 tablet (40 mg total) by mouth daily. Start taking on: July 24, 2021   HYDROcodone-acetaminophen 10-325 MG tablet Commonly known as: NORCO Take 1 tablet by mouth 4 (four) times daily as needed for pain.    multivitamin with minerals tablet Take 1 tablet by mouth daily.   Otezla 30 MG Tabs Generic drug: Apremilast Take 30 mg by mouth 2 (two) times daily.   potassium chloride SA 20 MEQ tablet Commonly known as: KLOR-CON M Take 20 mEq by mouth daily.   spironolactone 100 MG tablet Commonly known as: ALDACTONE Take 1 tablet (100 mg total) by mouth daily. Start taking on: July 24, 2021        Follow-up Information     [facility]. Schedule an appointment as soon as possible for a visit in 1 week(s).   Contact information: [address] [place] [address] [phone]                  If you experience worsening of your admission symptoms, develop shortness of breath, life threatening emergency, suicidal or homicidal thoughts you must seek medical attention immediately by calling 911 or calling your MD immediately  if symptoms less severe.   You must read complete instructions/literature along with all the possible adverse reactions/side effects for all the medicines you take and that have been prescribed to you. Take any new medicines after you have completely understood and accept all the possible adverse reactions/side effects.    Please note   You were cared for by a hospitalist during your hospital stay. If you have any questions about your discharge medications or the care you received while you were in the hospital after you are discharged, you can call the unit and asked to speak with the hospitalist on call if the hospitalist that took care of you is not available. Once you are discharged, your primary care physician will handle any further medical issues. Please note that NO REFILLS for any discharge medications will be authorized once you are discharged, as it is imperative that you return to your primary care physician (or establish a relationship with a primary care physician if you do not have  one) for your aftercare needs so that they  can reassess your need for medications and monitor your lab values.       Time coordinating discharge: 34 minutes  Signed:  Zaryia Markel  [facility] 07/23/2021, 3:15 PM   Pager on [email or web address]. If 7PM-7AM, please contact night-coverage at [email or web address]

## 2021-07-23 NOTE — Progress Notes (Signed)
Patient being discharged. Paperwork including new medications reviewed with patient and patients wife. Both verbalized understanding. Patient wife will drive patient home.

## 2021-07-24 LAB — BODY FLUID CULTURE W GRAM STAIN: Culture: NO GROWTH

## 2021-08-10 ENCOUNTER — Encounter: Payer: Self-pay | Admitting: Family Medicine

## 2021-10-07 ENCOUNTER — Ambulatory Visit (INDEPENDENT_AMBULATORY_CARE_PROVIDER_SITE_OTHER): Payer: 59 | Admitting: Gastroenterology

## 2021-10-07 ENCOUNTER — Other Ambulatory Visit: Payer: Self-pay

## 2021-10-07 VITALS — BP 110/66 | HR 76 | Temp 98.2°F | Ht 67.0 in | Wt 227.0 lb

## 2021-10-07 DIAGNOSIS — B182 Chronic viral hepatitis C: Secondary | ICD-10-CM

## 2021-10-07 NOTE — Progress Notes (Signed)
? ? ?Gastroenterology Consultation ? ?Referring Provider:     Dortha Kern, MD ?Primary Care Physician:  Dortha Kern, MD ?Primary Gastroenterologist:  Dr. Servando Snare     ?Reason for Consultation:     Cirrhosis ?      ? HPI:   ?Cameron Shelton is a 56 y.o. y/o male referred for consultation & management of Cirrhosis by Dr. Quillian Quince, Doreene Nest, MD.  This patient comes in today after being seen in the past.  The hospital for a distended abdomen and the finding of cirrhosis.  The patient states he has drank 8-10 beers ever since his 35s.  The patient also has a history of hepatitis C and was seen by Dr. Shelle Iron in 2016.  The patient has never been treated for his hepatitis C.  The patient was recently in the hospital and sent home on Lasix and Aldactone.  The patient states that he stopped drinking just prior to being admitted to the hospital.  There is no report of any black stools or bloody stools.  The patient also denies ever having a colonoscopy in the past.  The patient had imaging of his liver that didn't show any masses.  ?The patient's past blood work showed him to have hypoalbuminemia, thrombocytopenia, hyponatremia and elevated AST. The patient comes with his wife today who provides some of the history. ? ?Past Medical History:  ?Diagnosis Date  ? Alcohol use disorder, severe, dependence (HCC)   ? Hepatitis C, chronic (HCC)   ? Hypertension   ? Tobacco use disorder   ? ? ?No past surgical history on file. ? ?Prior to Admission medications   ?Medication Sig Start Date End Date Taking? Authorizing Provider  ?amLODipine (NORVASC) 10 MG tablet Take 10 mg by mouth daily.   Yes [provider]  ?Apremilast (OTEZLA) 30 MG TABS Take 30 mg by mouth 2 (two) times daily.   Yes [provider]  ?escitalopram (LEXAPRO) 10 MG tablet Take 10 mg by mouth daily.   Yes [provider]  ?furosemide (LASIX) 40 MG tablet Take 1 tablet (40 mg total) by mouth daily. 07/24/21  Yes Lurene Shadow, MD  ?Multiple  Vitamins-Minerals (MULTIVITAMIN WITH MINERALS) tablet Take 1 tablet by mouth daily.   Yes [provider]  ?oxyCODONE (OXY IR/ROXICODONE) 5 MG immediate release tablet Take 5 mg by mouth 3 (three) times daily as needed. 10/04/21  Yes [provider]  ?potassium chloride SA (KLOR-CON M) 20 MEQ tablet Take 20 mEq by mouth daily.   Yes [provider]  ?spironolactone (ALDACTONE) 100 MG tablet Take 1 tablet (100 mg total) by mouth daily. 07/24/21  Yes Lurene Shadow, MD  ? ? ?Family History  ?Problem Relation Age of Onset  ? Hypertension Father   ?  ? ?Social History  ? ?Tobacco Use  ? Smoking status: Every Day  ?  Packs/day: 0.50  ?  Types: Cigarettes  ?Substance Use Topics  ? Alcohol use: Yes  ? Drug use: Not Currently  ? ? ?Allergies as of 10/07/2021  ? (No Known Allergies)  ? ? ?Review of Systems:    ?All systems reviewed and negative except where noted in HPI. ? ? Physical Exam:  ?BP 110/66   Pulse 76   Temp 98.2 ?F (36.8 ?C) (Oral)   Ht 5\' 7"  (1.702 m)   Wt 227 lb (103 kg)   BMI 35.55 kg/m?  ?No LMP for male patient. ?General:   Alert,  Well-developed, well-nourished, pleasant and  cooperative in NAD ?Head:  Normocephalic and atraumatic. ?Eyes:  Sclera clear, no icterus.   Conjunctiva pink. ?Ears:  Normal auditory acuity. ?Neck:  Supple; no masses or thyromegaly. ?Lungs:  Respirations even and unlabored.  Clear throughout to auscultation.   No wheezes, crackles, or rhonchi. No acute distress. ?Heart:  Regular rate and rhythm; no murmurs, clicks, rubs, or gallops. ?Abdomen:  Normal bowel sounds.  No bruits.  Soft, non-tender and non-distended without masses, hepatosplenomegaly or hernias noted.  No guarding or rebound tenderness.  Negative Carnett sign.   ?Rectal:  Deferred.  ?Pulses:  Normal pulses noted. ?Extremities:  No clubbing or edema.  No cyanosis. ?Neurologic:  Alert and oriented x3;  grossly normal neurologically. ?Skin:  Intact without significant lesions or rashes.  No  jaundice. ?Lymph Nodes:  No significant cervical adenopathy. ?Psych:  Alert and cooperative. Normal mood and affect. ? ?Imaging Studies: ?No results found. ? ?Assessment and Plan:  ? ?Cameron Shelton is a 56 y.o. y/o male who comes in with a history of alcoholic cirrhosis with a history of hepatitis C. The patient has been told that he should be treated for his hepatitis C and because he has decompensated cirrhosis he will be sent to [facility] for treatment.  The patient has also been told to avoid NSAIDs and iron supplementation.  He has also been told not cook with an iron skillet.  The patient has been encouraged to eat fish protein opposed to red meat protein.  The patient has also been told to consider having a colonoscopy since he has not had a colonoscopy in the past. He refuses a colonoscopy at this time but has agreed to undergoing an upper endoscopy.  The patient will have lab work sent off today including checking for his immunity to hepatitis a and hepatitis B and has been told that he will be vaccinated accordingly. The patient has also been told to continue his abstinence from alcohol. The patient has also been told that he will need imaging of his liver every 6 months for hepatocellular carcinoma surveillance.  The patient will be followed up at the time of his EGD to look for varices.  The patient will also be referred to [facility] for treatment for his hepatitis C.  The patient and his wife have been explained the plan and agree with it. ? ? ? ?Midge Minium, MD. Clementeen Graham ? ? ? Note: This dictation was prepared with Dragon dictation along with smaller phrase technology. Any transcriptional errors that result from this process are unintentional.   ?

## 2021-10-08 ENCOUNTER — Telehealth: Payer: Self-pay

## 2021-10-08 ENCOUNTER — Other Ambulatory Visit
Admission: RE | Admit: 2021-10-08 | Discharge: 2021-10-08 | Disposition: A | Discharge status: Home / Self Care | Payer: 59 | Attending: Gastroenterology | Admitting: Gastroenterology

## 2021-10-08 DIAGNOSIS — D696 Thrombocytopenia, unspecified: Secondary | ICD-10-CM | POA: Insufficient documentation

## 2021-10-08 LAB — CBC
HCT: 38 % — ABNORMAL LOW (ref 39.0–52.0)
Hemoglobin: 12.8 g/dL — ABNORMAL LOW (ref 13.0–17.0)
MCH: 31.9 pg (ref 26.0–34.0)
MCHC: 33.7 g/dL (ref 30.0–36.0)
MCV: 94.8 fL (ref 80.0–100.0)
Platelets: 168 10*3/uL (ref 150–400)
RBC: 4.01 MIL/uL — ABNORMAL LOW (ref 4.22–5.81)
RDW: 15.9 % — ABNORMAL HIGH (ref 11.5–15.5)
WBC: 10.1 10*3/uL (ref 4.0–10.5)
nRBC: 0 % (ref 0.0–0.2)

## 2021-10-08 NOTE — Telephone Encounter (Signed)
Pt returned my call and states that he will go now to [facility] to the lab for a STAT CBC ?

## 2021-10-08 NOTE — Telephone Encounter (Signed)
Pt had abnormal labs and he needs to have a stat lab to recheck his platelets and if it is still low, pt will need to be admitted to the hospital ?

## 2021-10-08 NOTE — Addendum Note (Signed)
Addended by: Roena Malady on: 10/08/2021 02:27 PM ? ? Modules accepted: Orders ? ?

## 2021-10-09 ENCOUNTER — Encounter: Payer: Self-pay | Admitting: Gastroenterology

## 2021-10-09 ENCOUNTER — Telehealth: Payer: Self-pay | Admitting: Gastroenterology

## 2021-10-09 NOTE — Telephone Encounter (Signed)
Critical lab from [facility] already addressed by melanie on 03/08 ?

## 2021-10-10 LAB — HCV RT-PCR, QUANT (NON-GRAPH)
HCV log10: 5.755 log10 IU/mL
Hepatitis C Quantitation: 569000 IU/mL

## 2021-10-10 LAB — HEPATIC FUNCTION PANEL
ALT: 24 IU/L (ref 0–44)
AST: 55 IU/L — ABNORMAL HIGH (ref 0–40)
Albumin: 3.1 g/dL — ABNORMAL LOW (ref 3.8–4.9)
Alkaline Phosphatase: 152 IU/L — ABNORMAL HIGH (ref 44–121)
Bilirubin Total: 0.7 mg/dL (ref 0.0–1.2)
Bilirubin, Direct: 0.39 mg/dL (ref 0.00–0.40)
Total Protein: 7.3 g/dL (ref 6.0–8.5)

## 2021-10-10 LAB — HEPATITIS A ANTIBODY, TOTAL: hep A Total Ab: POSITIVE — AB

## 2021-10-10 LAB — HCV AB W REFLEX TO QUANT PCR: HCV Ab: REACTIVE — AB

## 2021-10-10 LAB — IRON,TIBC AND FERRITIN PANEL
Ferritin: 63 ng/mL (ref 30–400)
Iron Saturation: 27 % (ref 15–55)
Iron: 95 ug/dL (ref 38–169)
Total Iron Binding Capacity: 347 ug/dL (ref 250–450)
UIBC: 252 ug/dL (ref 111–343)

## 2021-10-10 LAB — PROTIME-INR
INR: 1.1 (ref 0.9–1.2)
Prothrombin Time: 12 s (ref 9.1–12.0)

## 2021-10-10 LAB — FIB-4 W/REFLEX NASH FIBROSURE: Platelets: <5 10*3/uL — CL (ref 150–450)

## 2021-10-10 LAB — HEPATITIS C GENOTYPE

## 2021-10-10 LAB — HEPATITIS B SURFACE ANTIBODY,QUALITATIVE: Hep B Surface Ab, Qual: NONREACTIVE

## 2021-10-16 ENCOUNTER — Telehealth: Payer: Self-pay

## 2021-10-16 NOTE — Telephone Encounter (Signed)
Referral along with progress notes, labs, demographics and insurance faxed to [facility] ?

## 2021-11-11 ENCOUNTER — Encounter: Payer: Self-pay | Admitting: Gastroenterology

## 2021-11-11 ENCOUNTER — Other Ambulatory Visit: Payer: Self-pay

## 2021-11-11 ENCOUNTER — Ambulatory Visit
Admission: RE | Admit: 2021-11-11 | Discharge: 2021-11-11 | Disposition: A | Discharge status: Home / Self Care | Payer: 59 | Attending: Gastroenterology | Admitting: Gastroenterology

## 2021-11-11 ENCOUNTER — Ambulatory Visit: Payer: 59 | Admitting: Registered Nurse

## 2021-11-11 ENCOUNTER — Encounter
Admission: RE | Disposition: A | Discharge status: Home / Self Care | Payer: Self-pay | Source: Home / Self Care | Attending: Gastroenterology

## 2021-11-11 DIAGNOSIS — K7031 Alcoholic cirrhosis of liver with ascites: Secondary | ICD-10-CM

## 2021-11-11 DIAGNOSIS — I10 Essential (primary) hypertension: Secondary | ICD-10-CM | POA: Diagnosis not present

## 2021-11-11 DIAGNOSIS — L409 Psoriasis, unspecified: Secondary | ICD-10-CM | POA: Insufficient documentation

## 2021-11-11 DIAGNOSIS — F17210 Nicotine dependence, cigarettes, uncomplicated: Secondary | ICD-10-CM | POA: Diagnosis not present

## 2021-11-11 DIAGNOSIS — K2970 Gastritis, unspecified, without bleeding: Secondary | ICD-10-CM | POA: Insufficient documentation

## 2021-11-11 DIAGNOSIS — B182 Chronic viral hepatitis C: Secondary | ICD-10-CM

## 2021-11-11 DIAGNOSIS — F32A Depression, unspecified: Secondary | ICD-10-CM | POA: Diagnosis not present

## 2021-11-11 DIAGNOSIS — I8510 Secondary esophageal varices without bleeding: Secondary | ICD-10-CM | POA: Diagnosis not present

## 2021-11-11 DIAGNOSIS — K7460 Unspecified cirrhosis of liver: Secondary | ICD-10-CM | POA: Insufficient documentation

## 2021-11-11 HISTORY — DX: Unspecified cirrhosis of liver: K74.60

## 2021-11-11 HISTORY — PX: ESOPHAGOGASTRODUODENOSCOPY (EGD) WITH PROPOFOL: SHX5813

## 2021-11-11 HISTORY — DX: Psoriasis, unspecified: L40.9

## 2021-11-11 HISTORY — DX: Dermatitis, unspecified: L30.9

## 2021-11-11 SURGERY — ESOPHAGOGASTRODUODENOSCOPY (EGD) WITH PROPOFOL
Anesthesia: General

## 2021-11-11 MED ORDER — LIDOCAINE HCL (CARDIAC) PF 100 MG/5ML IV SOSY
PREFILLED_SYRINGE | INTRAVENOUS | Status: DC | PRN
  Administered 2021-11-11: 60 mg via INTRAVENOUS

## 2021-11-11 MED ORDER — DEXMEDETOMIDINE (PRECEDEX) IN NS 20 MCG/5ML (4 MCG/ML) IV SYRINGE
PREFILLED_SYRINGE | INTRAVENOUS | Status: DC | PRN
  Administered 2021-11-11: 8 ug via INTRAVENOUS

## 2021-11-11 MED ORDER — PROPOFOL 10 MG/ML IV BOLUS
INTRAVENOUS | Status: DC | PRN
  Administered 2021-11-11: 30 mg via INTRAVENOUS
  Administered 2021-11-11: 70 mg via INTRAVENOUS
  Administered 2021-11-11 (×3): 30 mg via INTRAVENOUS

## 2021-11-11 MED ORDER — SODIUM CHLORIDE 0.9 % IV SOLN: INTRAVENOUS | Status: DC

## 2021-11-11 NOTE — Transfer of Care (Signed)
Immediate Anesthesia Transfer of Care Note ? ?Patient: Cameron Shelton ? ?Procedure(s) Performed: ESOPHAGOGASTRODUODENOSCOPY (EGD) WITH PROPOFOL ? ?Patient Location: PACU ? ?Anesthesia Type:General ? ?Level of Consciousness: awake, alert  and oriented ? ?Airway & Oxygen Therapy: Patient Spontanous Breathing ? ?Post-op Assessment: Report given to RN and Post -op Vital signs reviewed and stable ? ?Post vital signs: Reviewed and stable ? ?Last Vitals:  ?Vitals Value Taken Time  ?BP 118/82 1029  ?Temp    ?Pulse 73   ?Resp 30   ?SpO2 96   ? ? ?Last Pain:  ?Vitals:  ? 11/11/21 1005  ?TempSrc: Temporal  ?PainSc: 0-No pain  ?   ? ?  ? ?Complications: No notable events documented. ?

## 2021-11-11 NOTE — Op Note (Signed)
[facility] ?Gastroenterology ?Patient Name: Cameron Shelton ?Procedure Date: 11/11/2021 10:02 AM ?MRN: [id number] ?Account #: [id number] ?Date of Birth: 19-Apr-1966 ?Admit Type: Outpatient ?Age: 56 ?Room: [facility] ENDO ROOM 4 ?Gender: Male ?Note Status: Finalized ?Instrument Name: Upper Endoscope [id number] ?Procedure:             Upper GI endoscopy ?Indications:           Cirrhosis rule out esophageal varices ?Providers:             Lucilla Lame MD, MD ?Medicines:             Propofol per Anesthesia ?Complications:         No immediate complications. ?Procedure:             Pre-Anesthesia Assessment: ?                       - Prior to the procedure, a History and Physical was  ?                       performed, and patient medications and allergies were  ?                       reviewed. The patient's tolerance of previous  ?                       anesthesia was also reviewed. The risks and benefits  ?                       of the procedure and the sedation options and risks  ?                       were discussed with the patient. All questions were  ?                       answered, and informed consent was obtained. Prior  ?                       Anticoagulants: The patient has taken no previous  ?                       anticoagulant or antiplatelet agents. ASA Grade  ?                       Assessment: III - A patient with severe systemic  ?                       disease. After reviewing the risks and benefits, the  ?                       patient was deemed in satisfactory condition to  ?                       undergo the procedure. ?                       After obtaining informed consent, the endoscope was  ?                       passed under direct vision. Throughout the  procedure,  ?                       the patient's blood pressure, pulse, and oxygen  ?                       saturations were monitored continuously. The Endoscope  ?                       was introduced through the mouth, and  advanced to the  ?                       second part of duodenum. The upper GI endoscopy was  ?                       accomplished without difficulty. The patient tolerated  ?                       the procedure well. ?Findings: ?     Four columns of grade III varices with no stigmata of recent bleeding  ?     were found in the lower third of the esophagus,. No red wale signs were  ?     present. Four bands were successfully placed with complete eradication,  ?     resulting in deflation of varices. There was no bleeding during the  ?     procedure. ?     Localized inflammation characterized by erosions was found in the  ?     gastric antrum. ?     The examined duodenum was normal. ?Impression:            - Grade III esophageal varices with no stigmata of  ?                       recent bleeding. Completely eradicated. Banded. ?                       - Gastritis. ?                       - Normal examined duodenum. ?                       - No specimens collected. ?Recommendation:        - Discharge patient to home. ?                       - Mechanical soft diet for 3 days. ?                       - Continue present medications. ?                       - Repeat upper endoscopy in 6 weeks for retreatment. ?Procedure Code(s):     --- Professional --- ?                       [phone], Esophagogastroduodenoscopy, flexible,  ?                       transoral; with band ligation of esophageal/gastric  ?  varices ?Diagnosis Code(s):     --- Professional --- ?                       K74.60, Unspecified cirrhosis of liver ?                       K29.70, Gastritis, unspecified, without bleeding ?CPT copyright 2019 American Medical Association. All rights reserved. ?The codes documented in this report are preliminary and upon coder review may  ?be revised to meet current compliance requirements. ?Lucilla Lame MD, MD ?11/11/2021 10:28:01 AM ?This report has been signed electronically. ?Number of Addenda: 0 ?Note  Initiated On: 11/11/2021 10:02 AM ?Estimated Blood Loss:  Estimated blood loss: none. ?     [facility] ?

## 2021-11-11 NOTE — Anesthesia Postprocedure Evaluation (Signed)
Anesthesia Post Note ? ?Patient: JOSHWA HEMRIC ? ?Procedure(s) Performed: ESOPHAGOGASTRODUODENOSCOPY (EGD) WITH PROPOFOL ? ?Patient location during evaluation: Endoscopy ?Anesthesia Type: General ?Level of consciousness: awake and alert ?Pain management: pain level controlled ?Vital Signs Assessment: post-procedure vital signs reviewed and stable ?Respiratory status: spontaneous breathing, nonlabored ventilation and respiratory function stable ?Cardiovascular status: blood pressure returned to baseline and stable ?Postop Assessment: no apparent nausea or vomiting ?Anesthetic complications: no ? ? ?No notable events documented. ? ? ?Last Vitals:  ?Vitals:  ? 11/11/21 1100 11/11/21 1110  ?BP: (!) 145/93 137/86  ?Pulse: (!) 57 61  ?Resp: 18 17  ?Temp:    ?SpO2: 100% 100%  ?  ?Last Pain:  ?Vitals:  ? 11/11/21 1005  ?TempSrc: Temporal  ?PainSc: 0-No pain  ? ? ?  ?  ?  ?  ?  ?  ? ?Foye Deer ? ? ? ? ?

## 2021-11-11 NOTE — H&P (Signed)
? ?Lucilla Lame, MD [facility] ?[address] ?[place], [place] [address] ?Phone:[phone] ?Fax : [phone] ? ?Primary Care Physician:  Lynnell Jude, MD ?Primary Gastroenterologist:  Dr. Allen Norris ? ?Pre-Procedure History & Physical: ?HPI:  Cameron Shelton is a 56 y.o. male is here for an endoscopy. ?  ?Past Medical History:  ?Diagnosis Date  ? Alcohol use disorder, severe, dependence ([place])   ? Cirrhosis ([place])   ? Eczema   ? Hepatitis C, chronic ([place])   ? Hypertension   ? Psoriasis   ? Tobacco use disorder   ? ? ?History reviewed. No pertinent surgical history. ? ?Prior to Admission medications   ?Medication Sig Start Date End Date Taking? Authorizing Provider  ?amLODipine (NORVASC) 10 MG tablet Take 10 mg by mouth daily.   Yes [provider]  ?escitalopram (LEXAPRO) 10 MG tablet Take 10 mg by mouth daily.   Yes [provider]  ?furosemide (LASIX) 40 MG tablet Take 1 tablet (40 mg total) by mouth daily. 07/24/21  Yes Jennye Boroughs, MD  ?oxyCODONE (OXY IR/ROXICODONE) 5 MG immediate release tablet Take 5 mg by mouth 3 (three) times daily as needed. 10/04/21  Yes [provider]  ?potassium chloride SA (KLOR-CON M) 20 MEQ tablet Take 20 mEq by mouth daily.   Yes [provider]  ?spironolactone (ALDACTONE) 100 MG tablet Take 1 tablet (100 mg total) by mouth daily. 07/24/21  Yes Jennye Boroughs, MD  ?Apremilast (OTEZLA) 30 MG TABS Take 30 mg by mouth 2 (two) times daily.    [provider]  ?Multiple Vitamins-Minerals (MULTIVITAMIN WITH MINERALS) tablet Take 1 tablet by mouth daily.    [provider]  ? ? ?Allergies as of 10/08/2021  ? (No Known Allergies)  ? ? ?Family History  ?Problem Relation Age of Onset  ? Hypertension Father   ? ? ?Social History  ? ?Socioeconomic History  ? Marital status: Married  ?  Spouse name: Not on file  ? Number of children: Not on file  ? Years of education: Not on file  ? Highest education level: Not on file  ?Occupational  History  ? Not on file  ?Tobacco Use  ? Smoking status: Every Day  ?  Packs/day: 0.50  ?  Types: Cigarettes  ? Smokeless tobacco: Not on file  ?Vaping Use  ? Vaping Use: Never used  ?Substance and Sexual Activity  ? Alcohol use: Not Currently  ? Drug use: Not Currently  ? Sexual activity: Not Currently  ?Other Topics Concern  ? Not on file  ?Social History Narrative  ? Not on file  ? ?Social Determinants of Health  ? ?Financial Resource Strain: Not on file  ?Food Insecurity: Not on file  ?Transportation Needs: Not on file  ?Physical Activity: Not on file  ?Stress: Not on file  ?Social Connections: Not on file  ?Intimate Partner Violence: Not on file  ? ? ?Review of Systems: ?See HPI, otherwise negative ROS ? ?Physical Exam: ?There were no vitals taken for this visit. ?General:   Alert,  pleasant and cooperative in NAD ?Head:  Normocephalic and atraumatic. ?Neck:  Supple; no masses or thyromegaly. ?Lungs:  Clear throughout to auscultation.    ?Heart:  Regular rate and rhythm. ?Abdomen:  Soft, nontender and nondistended. Normal bowel sounds, without guarding, and without rebound.   ?Neurologic:  Alert and  oriented x4;  grossly normal neurologically. ? ?Impression/Plan: ?Cameron Shelton is here for an endoscopy to be performed for cirrhosis ? ?Risks,  benefits, limitations, and alternatives regarding  endoscopy have been reviewed with the patient.  Questions have been answered.  All parties agreeable. ? ? ?Lucilla Lame, MD  11/11/2021, 10:02 AM ?

## 2021-11-11 NOTE — Anesthesia Preprocedure Evaluation (Addendum)
Anesthesia Evaluation  Patient identified by MRN, date of birth, ID band Patient awake    Reviewed: Allergy & Precautions, NPO status , Patient's Chart, lab work & pertinent test results  Airway Mallampati: II  TM Distance: >3 FB Neck ROM: full    Dental  (+) Edentulous Upper, Edentulous Lower   Pulmonary Current SmokerPatient did not abstain from smoking.,    Pulmonary exam normal        Cardiovascular Exercise Tolerance: Good hypertension, Pt. on medications Normal cardiovascular exam     Neuro/Psych PSYCHIATRIC DISORDERS Depression negative neurological ROS     GI/Hepatic negative GI ROS, (+) Cirrhosis   Esophageal Varices and ascites    ,   Endo/Other  negative endocrine ROS  Renal/GU negative Renal ROS  negative genitourinary   Musculoskeletal   Abdominal Normal abdominal exam  (+)   Peds  Hematology negative hematology ROS (+)   Anesthesia Other Findings Past Medical History: No date: Alcohol use disorder, severe, dependence (HCC) No date: Cirrhosis (HCC) No date: Eczema No date: Hepatitis C, chronic (HCC) No date: Hypertension No date: Psoriasis No date: Tobacco use disorder  History reviewed. No pertinent surgical history.     Reproductive/Obstetrics negative OB ROS                            Anesthesia Physical Anesthesia Plan  ASA: 3  Anesthesia Plan: General   Post-op Pain Management:    Induction: Intravenous  PONV Risk Score and Plan: Propofol infusion and TIVA  Airway Management Planned: Natural Airway  Additional Equipment:   Intra-op Plan:   Post-operative Plan:   Informed Consent: I have reviewed the patients History and Physical, chart, labs and discussed the procedure including the risks, benefits and alternatives for the proposed anesthesia with the patient or authorized representative who has indicated his/her understanding and acceptance.      Dental advisory given  Plan Discussed with: Anesthesiologist, CRNA and Surgeon  Anesthesia Plan Comments:        Anesthesia Quick Evaluation

## 2021-11-12 ENCOUNTER — Encounter: Payer: Self-pay | Admitting: Gastroenterology

## 2021-11-17 ENCOUNTER — Telehealth: Payer: Self-pay

## 2021-11-17 ENCOUNTER — Telehealth: Payer: Self-pay | Admitting: Gastroenterology

## 2021-11-17 ENCOUNTER — Other Ambulatory Visit: Payer: Self-pay

## 2021-11-17 DIAGNOSIS — K7031 Alcoholic cirrhosis of liver with ascites: Secondary | ICD-10-CM

## 2021-11-17 NOTE — Progress Notes (Signed)
Billing letter printed for EGD. ?

## 2021-11-17 NOTE — Telephone Encounter (Signed)
Wife Bonita Quin is calling to schedule pts EGD, wife advises Dr Servando Snare wants repeat EGD in 6 weeks ?

## 2021-11-17 NOTE — Progress Notes (Signed)
6 week repeat EGD scheduled with Dr. Servando Snare for 01/06/22 at [facility]. ? ?Thanks, ?Marcelino Duster, CMA ?

## 2021-11-17 NOTE — Telephone Encounter (Signed)
Dr. Servando Snare, ? ?Mr. Mashek wife would like to know if her husband can have some of the fluid drained off his stomach.  Please advise. ? ?Thanks, ?Marcelino Duster, CMA ?

## 2021-11-21 NOTE — Telephone Encounter (Signed)
Cameron Minium, MD  Avie Arenas, CMA 4 days ago  ? ?Yes please set him up for an ultrasound-guided paracentesis.   ? ?

## 2021-11-21 NOTE — Addendum Note (Signed)
Addended by: Littie Deeds Y on: 11/21/2021 10:00 AM ? ? Modules accepted: Orders ? ?

## 2021-11-21 NOTE — Telephone Encounter (Signed)
Order has been faxed for paracentesis ?

## 2021-11-24 ENCOUNTER — Ambulatory Visit
Admission: RE | Admit: 2021-11-24 | Discharge: 2021-11-24 | Disposition: A | Discharge status: Home / Self Care | Payer: 59 | Source: Ambulatory Visit | Attending: Gastroenterology | Admitting: Gastroenterology

## 2021-11-24 DIAGNOSIS — K7031 Alcoholic cirrhosis of liver with ascites: Secondary | ICD-10-CM | POA: Insufficient documentation

## 2021-11-24 NOTE — Procedures (Signed)
PROCEDURE SUMMARY: ? ?Successful ultrasound guided paracentesis from the RLQ. ?Yielded 6.8 L of clear yellow fluid.  ?No immediate complications.  ?The patient tolerated the procedure well.  ? ?Specimen was not sent for labs. ? ?EBL <  2mL ? ?If the patient eventually requires >/=2 paracenteses in a 30 day period, screening evaluation by the [facility] will be assessed. ? ?Tsosie Billing D, PA-C ?11/24/2021, 4:10 PM ? ? ? ?

## 2022-01-06 ENCOUNTER — Encounter
Admission: RE | Disposition: A | Discharge status: Home / Self Care | Payer: Self-pay | Source: Home / Self Care | Attending: Gastroenterology

## 2022-01-06 ENCOUNTER — Encounter: Payer: Self-pay | Admitting: Gastroenterology

## 2022-01-06 ENCOUNTER — Ambulatory Visit
Admission: RE | Admit: 2022-01-06 | Discharge: 2022-01-06 | Disposition: A | Discharge status: Home / Self Care | Payer: 59 | Attending: Gastroenterology | Admitting: Gastroenterology

## 2022-01-06 ENCOUNTER — Ambulatory Visit: Payer: 59 | Admitting: Anesthesiology

## 2022-01-06 DIAGNOSIS — K259 Gastric ulcer, unspecified as acute or chronic, without hemorrhage or perforation: Secondary | ICD-10-CM | POA: Insufficient documentation

## 2022-01-06 DIAGNOSIS — I8510 Secondary esophageal varices without bleeding: Secondary | ICD-10-CM | POA: Diagnosis present

## 2022-01-06 DIAGNOSIS — B182 Chronic viral hepatitis C: Secondary | ICD-10-CM | POA: Diagnosis not present

## 2022-01-06 DIAGNOSIS — K2980 Duodenitis without bleeding: Secondary | ICD-10-CM | POA: Insufficient documentation

## 2022-01-06 DIAGNOSIS — K766 Portal hypertension: Secondary | ICD-10-CM | POA: Diagnosis not present

## 2022-01-06 DIAGNOSIS — I10 Essential (primary) hypertension: Secondary | ICD-10-CM | POA: Insufficient documentation

## 2022-01-06 DIAGNOSIS — K3189 Other diseases of stomach and duodenum: Secondary | ICD-10-CM | POA: Diagnosis not present

## 2022-01-06 DIAGNOSIS — K7460 Unspecified cirrhosis of liver: Secondary | ICD-10-CM | POA: Diagnosis not present

## 2022-01-06 DIAGNOSIS — K7031 Alcoholic cirrhosis of liver with ascites: Secondary | ICD-10-CM

## 2022-01-06 HISTORY — PX: ESOPHAGOGASTRODUODENOSCOPY (EGD) WITH PROPOFOL: SHX5813

## 2022-01-06 SURGERY — ESOPHAGOGASTRODUODENOSCOPY (EGD) WITH PROPOFOL
Anesthesia: General

## 2022-01-06 MED ORDER — LIDOCAINE HCL (CARDIAC) PF 100 MG/5ML IV SOSY
PREFILLED_SYRINGE | INTRAVENOUS | Status: DC | PRN
  Administered 2022-01-06: 80 mg via INTRAVENOUS

## 2022-01-06 MED ORDER — SODIUM CHLORIDE 0.9 % IV SOLN: INTRAVENOUS | Status: DC

## 2022-01-06 MED ORDER — PROPOFOL 10 MG/ML IV BOLUS
INTRAVENOUS | Status: DC | PRN
  Administered 2022-01-06 (×2): 20 mg via INTRAVENOUS
  Administered 2022-01-06: 10 mg via INTRAVENOUS
  Administered 2022-01-06: 90 mg via INTRAVENOUS

## 2022-01-06 MED ORDER — DEXMEDETOMIDINE (PRECEDEX) IN NS 20 MCG/5ML (4 MCG/ML) IV SYRINGE
PREFILLED_SYRINGE | INTRAVENOUS | Status: DC | PRN
  Administered 2022-01-06: 8 ug via INTRAVENOUS

## 2022-01-06 MED ORDER — GLYCOPYRROLATE 0.2 MG/ML IJ SOLN
INTRAMUSCULAR | Status: DC | PRN
  Administered 2022-01-06: .2 mg via INTRAVENOUS

## 2022-01-06 NOTE — H&P (Signed)
Midge Minium, MD [facility] [address] [place], [place] [address] Phone:[phone] Fax : [phone]  Primary Care Physician:  Dortha Kern, MD Primary Gastroenterologist:  Dr. Servando Snare  Pre-Procedure History & Physical: HPI:  Cameron Shelton is a 56 y.o. male is here for an endoscopy.   Past Medical History:  Diagnosis Date   Alcohol use disorder, severe, dependence (HCC)    Cirrhosis (HCC)    Eczema    Hepatitis C, chronic (HCC)    Hypertension    Psoriasis    Tobacco use disorder     Past Surgical History:  Procedure Laterality Date   ESOPHAGOGASTRODUODENOSCOPY (EGD) WITH PROPOFOL N/A 11/11/2021   Procedure: ESOPHAGOGASTRODUODENOSCOPY (EGD) WITH PROPOFOL;  Surgeon: Midge Minium, MD;  Location: [facility] ENDOSCOPY;  Service: Endoscopy;  Laterality: N/A;    Prior to Admission medications   Medication Sig Start Date End Date Taking? Authorizing Provider  amLODipine (NORVASC) 10 MG tablet Take 10 mg by mouth daily.   Yes [provider]  Apremilast (OTEZLA) 30 MG TABS Take 30 mg by mouth 2 (two) times daily.   Yes [provider]  escitalopram (LEXAPRO) 10 MG tablet Take 10 mg by mouth daily.   Yes [provider]  furosemide (LASIX) 40 MG tablet Take 1 tablet (40 mg total) by mouth daily. 07/24/21  Yes Lurene Shadow, MD  Multiple Vitamins-Minerals (MULTIVITAMIN WITH MINERALS) tablet Take 1 tablet by mouth daily.   Yes [provider]  oxyCODONE (OXY IR/ROXICODONE) 5 MG immediate release tablet Take 5 mg by mouth 3 (three) times daily as needed. 10/04/21  Yes [provider]  potassium chloride SA (KLOR-CON M) 20 MEQ tablet Take 20 mEq by mouth daily.   Yes [provider]  spironolactone (ALDACTONE) 100 MG tablet Take 1 tablet (100 mg total) by mouth daily. 07/24/21  Yes Lurene Shadow, MD    Allergies as of 11/17/2021   (No Known Allergies)    Family History  Problem Relation Age of Onset   Hypertension Father      Social History   Socioeconomic History   Marital status: Married    Spouse name: Not on file   Number of children: Not on file   Years of education: Not on file   Highest education level: Not on file  Occupational History   Not on file  Tobacco Use   Smoking status: Every Day    Packs/day: 0.50    Types: Cigarettes   Smokeless tobacco: Not on file  Vaping Use   Vaping Use: Never used  Substance and Sexual Activity   Alcohol use: Not Currently   Drug use: Not Currently   Sexual activity: Not Currently  Other Topics Concern   Not on file  Social History Narrative   Not on file   Social Determinants of Health   Financial Resource Strain: Not on file  Food Insecurity: Not on file  Transportation Needs: Not on file  Physical Activity: Not on file  Stress: Not on file  Social Connections: Not on file  Intimate Partner Violence: Not on file    Review of Systems: See HPI, otherwise negative ROS  Physical Exam: BP 105/79   Pulse (!) 57   Temp 98.2 F (36.8 C) (Temporal)   Resp 16   Ht 5\' 5"  (1.651 m)   Wt 90.7 kg   SpO2 100%   BMI 33.28 kg/m  General:   Alert,  pleasant and cooperative in NAD Head:  Normocephalic and atraumatic.  Neck:  Supple; no masses or thyromegaly. Lungs:  Clear throughout to auscultation.    Heart:  Regular rate and rhythm. Abdomen:  Soft, nontender and nondistended. Normal bowel sounds, without guarding, and without rebound.   Neurologic:  Alert and  oriented x4;  grossly normal neurologically.  Impression/Plan: Cameron Shelton is here for an endoscopy to be performed for cirrhosis  Risks, benefits, limitations, and alternatives regarding  endoscopy have been reviewed with the patient.  Questions have been answered.  All parties agreeable.   Midge Minium, MD  01/06/2022, 8:21 AM

## 2022-01-06 NOTE — Transfer of Care (Signed)
Immediate Anesthesia Transfer of Care Note  Patient: Cameron Shelton  Procedure(s) Performed: ESOPHAGOGASTRODUODENOSCOPY (EGD) WITH PROPOFOL  Patient Location: PACU  Anesthesia Type:General  Level of Consciousness: awake, alert  and oriented  Airway & Oxygen Therapy: Patient Spontanous Breathing  Post-op Assessment: Report given to RN and Post -op Vital signs reviewed and stable  Post vital signs: Reviewed and stable  Last Vitals:  Vitals Value Taken Time  BP    Temp    Pulse    Resp    SpO2      Last Pain:  Vitals:   01/06/22 0737  TempSrc: Temporal  PainSc: 0-No pain         Complications: No notable events documented.

## 2022-01-06 NOTE — Anesthesia Preprocedure Evaluation (Signed)
Anesthesia Evaluation  Patient identified by MRN, date of birth, ID band Patient awake    Reviewed: Allergy & Precautions, H&P , NPO status , Patient's Chart, lab work & pertinent test results, reviewed documented beta blocker date and time   Airway Mallampati: II   Neck ROM: full    Dental  (+) Poor Dentition   Pulmonary neg pulmonary ROS, Current Smoker and Patient abstained from smoking.,    Pulmonary exam normal        Cardiovascular Exercise Tolerance: Poor hypertension, On Medications negative cardio ROS Normal cardiovascular exam Rhythm:regular Rate:Normal     Neuro/Psych negative neurological ROS  negative psych ROS   GI/Hepatic negative GI ROS, (+) Hepatitis -  Endo/Other  negative endocrine ROS  Renal/GU negative Renal ROS  negative genitourinary   Musculoskeletal   Abdominal   Peds  Hematology negative hematology ROS (+)   Anesthesia Other Findings Past Medical History: No date: Alcohol use disorder, severe, dependence (HCC) No date: Cirrhosis (HCC) No date: Eczema No date: Hepatitis C, chronic (HCC) No date: Hypertension No date: Psoriasis No date: Tobacco use disorder Past Surgical History: 11/11/2021: ESOPHAGOGASTRODUODENOSCOPY (EGD) WITH PROPOFOL; N/A     Comment:  Procedure: ESOPHAGOGASTRODUODENOSCOPY (EGD) WITH               PROPOFOL;  Surgeon: Midge Minium, MD;  Location: [facility]               ENDOSCOPY;  Service: Endoscopy;  Laterality: N/A;   Reproductive/Obstetrics negative OB ROS                             Anesthesia Physical Anesthesia Plan  ASA: 3  Anesthesia Plan: General   Post-op Pain Management:    Induction:   PONV Risk Score and Plan:   Airway Management Planned:   Additional Equipment:   Intra-op Plan:   Post-operative Plan:   Informed Consent: I have reviewed the patients History and Physical, chart, labs and discussed the procedure  including the risks, benefits and alternatives for the proposed anesthesia with the patient or authorized representative who has indicated his/her understanding and acceptance.     Dental Advisory Given  Plan Discussed with: CRNA  Anesthesia Plan Comments:         Anesthesia Quick Evaluation

## 2022-01-06 NOTE — Op Note (Signed)
[facility] Gastroenterology Patient Name: Cameron Shelton Procedure Date: 01/06/2022 8:15 AM MRN: [id number] Account #: [id number] Date of Birth: 06-27-1966 Admit Type: Outpatient Age: 56 Room: [facility] ENDO ROOM 4 Gender: Male Note Status: Finalized Instrument Name: Upper Endoscope [id number] Procedure:             Upper GI endoscopy Indications:           Esophageal varices Providers:             Lucilla Lame MD, MD Referring MD:          Lynnell Jude (Referring MD) Medicines:             Propofol per Anesthesia Complications:         No immediate complications. Procedure:             Pre-Anesthesia Assessment:                        - Prior to the procedure, a History and Physical was                         performed, and patient medications and allergies were                         reviewed. The patient's tolerance of previous                         anesthesia was also reviewed. The risks and benefits                         of the procedure and the sedation options and risks                         were discussed with the patient. All questions were                         answered, and informed consent was obtained. Prior                         Anticoagulants: The patient has taken no previous                         anticoagulant or antiplatelet agents. ASA Grade                         Assessment: II - A patient with mild systemic disease.                         After reviewing the risks and benefits, the patient                         was deemed in satisfactory condition to undergo the                         procedure.                        After obtaining informed consent, the endoscope was  passed under direct vision. Throughout the procedure,                         the patient's blood pressure, pulse, and oxygen                         saturations were monitored continuously. The Endoscope                         was  introduced through the mouth, and advanced to the                         second part of duodenum. The upper GI endoscopy was                         accomplished without difficulty. The patient tolerated                         the procedure well. Findings:      Grade III varices were found in the lower third of the esophagus. Three       bands were successfully placed with complete eradication, resulting in       deflation of varices. There was no bleeding during the procedure.      Moderate portal hypertensive gastropathy was found in the entire       examined stomach.      One non-bleeding superficial gastric ulcer with no stigmata of bleeding       was found in the gastric antrum.      Mild inflammation was found in the duodenal bulb. Impression:            - Grade III esophageal varices. Completely eradicated.                         Banded.                        - Portal hypertensive gastropathy.                        - Non-bleeding gastric ulcer with no stigmata of                         bleeding.                        - Duodenitis.                        - No specimens collected. Recommendation:        - Discharge patient to home.                        - Resume previous diet.                        - Continue present medications.                        - Return to my office in 4 weeks. Procedure Code(s):     --- Professional ---  43244, Esophagogastroduodenoscopy, flexible,                         transoral; with band ligation of esophageal/gastric                         varices Diagnosis Code(s):     --- Professional ---                        I85.00, Esophageal varices without bleeding                        K76.6, Portal hypertension                        K25.9, Gastric ulcer, unspecified as acute or chronic,                         without hemorrhage or perforation CPT copyright 2019 American Medical Association. All rights reserved. The  codes documented in this report are preliminary and upon coder review may  be revised to meet current compliance requirements. Lucilla Lame MD, MD 01/06/2022 8:38:56 AM This report has been signed electronically. Number of Addenda: 0 Note Initiated On: 01/06/2022 8:15 AM Estimated Blood Loss:  Estimated blood loss: none.      [facility]

## 2022-01-06 NOTE — Anesthesia Postprocedure Evaluation (Signed)
Anesthesia Post Note  Patient: Cameron Shelton  Procedure(s) Performed: ESOPHAGOGASTRODUODENOSCOPY (EGD) WITH PROPOFOL  Patient location during evaluation: PACU Anesthesia Type: General Level of consciousness: awake and alert Pain management: pain level controlled Vital Signs Assessment: post-procedure vital signs reviewed and stable Respiratory status: spontaneous breathing, nonlabored ventilation, respiratory function stable and patient connected to nasal cannula oxygen Cardiovascular status: blood pressure returned to baseline and stable Postop Assessment: no apparent nausea or vomiting Anesthetic complications: no   No notable events documented.   Last Vitals:  Vitals:   01/06/22 0848 01/06/22 0858  BP: 123/85 (!) 138/93  Pulse: 77   Resp: (!) 21 18  Temp:    SpO2: 97%     Last Pain:  Vitals:   01/06/22 0858  TempSrc:   PainSc: 0-No pain                 Yevette Edwards

## 2022-01-07 ENCOUNTER — Encounter: Payer: Self-pay | Admitting: Gastroenterology

## 2022-01-19 ENCOUNTER — Telehealth: Payer: Self-pay | Admitting: Gastroenterology

## 2022-01-19 NOTE — Telephone Encounter (Signed)
Patient needs to reschedule appointment from 02/04/2022 to 02/05/2022. Tried calling patient on both home and mobile numbers. Patient did not answer. Left vm on home phone.

## 2022-01-20 NOTE — Telephone Encounter (Signed)
Left message on voicemail.

## 2022-01-20 NOTE — Telephone Encounter (Signed)
Pt has been r/s  

## 2022-02-04 ENCOUNTER — Ambulatory Visit: Payer: 59 | Admitting: Gastroenterology

## 2022-02-05 ENCOUNTER — Encounter: Payer: Self-pay | Admitting: Gastroenterology

## 2022-02-05 ENCOUNTER — Ambulatory Visit (INDEPENDENT_AMBULATORY_CARE_PROVIDER_SITE_OTHER): Payer: 59 | Admitting: Gastroenterology

## 2022-02-05 VITALS — BP 113/75 | HR 62 | Temp 98.4°F | Ht 67.0 in | Wt 198.0 lb

## 2022-02-05 DIAGNOSIS — R772 Abnormality of alphafetoprotein: Secondary | ICD-10-CM

## 2022-02-05 DIAGNOSIS — K7031 Alcoholic cirrhosis of liver with ascites: Secondary | ICD-10-CM

## 2022-02-05 MED ORDER — SPIRONOLACTONE 100 MG PO TABS: 150.0000 mg | ORAL_TABLET | Freq: Every day | ORAL | 1 refills | Status: DC

## 2022-02-05 NOTE — Patient Instructions (Signed)
If you need to cancel or reschedule your ultrasound, please call [phone]  I will fax referral to [facility] Liver clinic

## 2022-02-05 NOTE — Progress Notes (Signed)
Primary Care Physician: Dortha Kern, MD  Primary Gastroenterologist:  Dr. Midge Minium  Chief Complaint  Patient presents with   Follow-up    HPI: Cameron Shelton is a 56 y.o. male here for follow-up with a history of cirrhosis and a recent EGD with banding of esophageal varices.  After the banding of esophageal varices the patient was recommended to follow-up with me in 4 weeks.  The patient is now here for that follow-up. The patient reports that he feels very tired and his abdomen is distended although not increasing much but stating stable.  Past Medical History:  Diagnosis Date   Alcohol use disorder, severe, dependence (HCC)    Cirrhosis (HCC)    Eczema    Hepatitis C, chronic (HCC)    Hypertension    Psoriasis    Tobacco use disorder     Current Outpatient Medications  Medication Sig Dispense Refill   amLODipine (NORVASC) 10 MG tablet Take 10 mg by mouth daily.     Apremilast (OTEZLA) 30 MG TABS Take 30 mg by mouth 2 (two) times daily.     escitalopram (LEXAPRO) 10 MG tablet Take 10 mg by mouth daily.     furosemide (LASIX) 40 MG tablet Take 1 tablet (40 mg total) by mouth daily. 30 tablet 0   Multiple Vitamins-Minerals (MULTIVITAMIN WITH MINERALS) tablet Take 1 tablet by mouth daily.     oxyCODONE (OXY IR/ROXICODONE) 5 MG immediate release tablet Take 5 mg by mouth 3 (three) times daily as needed.     potassium chloride SA (KLOR-CON M) 20 MEQ tablet Take 20 mEq by mouth daily.     spironolactone (ALDACTONE) 100 MG tablet Take 1 tablet (100 mg total) by mouth daily. 30 tablet 0   No current facility-administered medications for this visit.    Allergies as of 02/05/2022   (No Known Allergies)    ROS:  General: Negative for anorexia, weight loss, fever, chills, fatigue, weakness. ENT: Negative for hoarseness, difficulty swallowing , nasal congestion. CV: Negative for chest pain, angina, palpitations, dyspnea on exertion, peripheral edema.  Respiratory:  Negative for dyspnea at rest, dyspnea on exertion, cough, sputum, wheezing.  GI: See history of present illness. GU:  Negative for dysuria, hematuria, urinary incontinence, urinary frequency, nocturnal urination.  Endo: Negative for unusual weight change.    Physical Examination:   BP 113/75   Pulse 62   Temp 98.4 F (36.9 C) (Oral)   Wt 198 lb (89.8 kg)   BMI 32.95 kg/m   General: Well-nourished, well-developed in no acute distress.  Eyes: No icterus. Conjunctivae pink. Extremities: No lower extremity edema. No clubbing or deformities. Neuro: Alert and oriented x 3.  Grossly intact. Skin: Warm and dry, no jaundice.   Psych: Alert and cooperative, normal mood and affect.  Labs:    Imaging Studies: No results found.  Assessment and Plan:   Cameron Shelton is a 56 y.o. y/o male who comes in today for follow-up after having esophageal banding..  The patient will be set up for a right upper quadrant ultrasound and alpha-fetoprotein for [facility] surveillance.  The patient has also been told that he needs a vaccination for hepatitis B.  Due to his D comes in cirrhosis and hepatitis C he has been encouraged to seek treatment for his hepatitis C at [facility]. The patient will also have his BMP checked and will be started on a low-dose of Lasix and Aldactone.  If his BMP in one week's time  is not changed significantly then we will increase the diuretics to better control his ascites.  The patient has been explained the plan and agrees with it.     Midge Minium, MD. Clementeen Graham    Note: This dictation was prepared with Dragon dictation along with smaller phrase technology. Any transcriptional errors that result from this process are unintentional.

## 2022-02-06 LAB — BASIC METABOLIC PANEL
BUN/Creatinine Ratio: 11 (ref 9–20)
BUN: 8 mg/dL (ref 6–24)
CO2: 22 mmol/L (ref 20–29)
Calcium: 8.7 mg/dL (ref 8.7–10.2)
Chloride: 102 mmol/L (ref 96–106)
Creatinine, Ser: 0.75 mg/dL — ABNORMAL LOW (ref 0.76–1.27)
Glucose: 97 mg/dL (ref 70–99)
Potassium: 4.1 mmol/L (ref 3.5–5.2)
Sodium: 135 mmol/L (ref 134–144)
eGFR: 105 mL/min/{1.73_m2} (ref >59)

## 2022-02-06 LAB — AFP TUMOR MARKER: AFP, Serum, Tumor Marker: 16.5 ng/mL — ABNORMAL HIGH (ref 0.0–8.4)

## 2022-02-10 NOTE — Addendum Note (Signed)
Addended by: Roena Malady on: 02/10/2022 10:41 AM   Modules accepted: Orders

## 2022-02-12 ENCOUNTER — Telehealth: Payer: Self-pay

## 2022-02-12 NOTE — Telephone Encounter (Signed)
Pt's wife dropped off FMLA paperwork for pt... She stated that she mentioned it to you and you said that she will just need to drop it off.... I did not see any mention in OV notes... Please advise if okay to fill out for 12 week leave

## 2022-02-12 NOTE — Telephone Encounter (Signed)
Yes.  Thank you.

## 2022-02-12 NOTE — Addendum Note (Signed)
Addended by: Roena Malady on: 02/12/2022 09:19 AM   Modules accepted: Orders

## 2022-02-13 ENCOUNTER — Ambulatory Visit: Payer: 59

## 2022-02-13 NOTE — Telephone Encounter (Signed)
Forms faxed x 2   Pt is aware and will update me if there is anything further is needed  Copy sent to be scanned

## 2022-02-16 ENCOUNTER — Ambulatory Visit
Admission: RE | Admit: 2022-02-16 | Discharge: 2022-02-16 | Disposition: A | Discharge status: Home / Self Care | Payer: 59 | Source: Ambulatory Visit | Attending: Gastroenterology | Admitting: Gastroenterology

## 2022-02-16 DIAGNOSIS — R772 Abnormality of alphafetoprotein: Secondary | ICD-10-CM | POA: Insufficient documentation

## 2022-02-16 DIAGNOSIS — K7031 Alcoholic cirrhosis of liver with ascites: Secondary | ICD-10-CM | POA: Insufficient documentation

## 2022-02-16 MED ORDER — GADOBUTROL 1 MMOL/ML IV SOLN
9.0000 mL | Freq: Once | INTRAVENOUS | Status: AC | PRN
  Administered 2022-02-16: 9 mL via INTRAVENOUS

## 2022-02-17 ENCOUNTER — Telehealth: Payer: Self-pay

## 2022-02-17 DIAGNOSIS — K7031 Alcoholic cirrhosis of liver with ascites: Secondary | ICD-10-CM

## 2022-02-17 NOTE — Addendum Note (Signed)
Addended by: Roena Malady on: 02/17/2022 03:03 PM   Modules accepted: Orders

## 2022-02-17 NOTE — Telephone Encounter (Signed)
Dr Servando Snare pt...  Pt's spouse Bonita Quin called concerned about pt's recent MRI results and would like to know what they mean... Also, she is concerned about the large volume of fluid present as she has noticed pt's abdomen swelling more.... Please advise

## 2022-02-17 NOTE — Telephone Encounter (Signed)
1. Small lesions in the liver seen - non specific- needs repeat MRI in 3 months please schedule 2 . Check BMP if stable will increase dose of lasix and aldactone to help get fluid out 3. Ensure repeat EGD scheduled with Dr Servando Snare for esophageal varices banding 4 weeks from last    Dr Wyline Mood MD,MRCP [facility]) Gastroenterology/Hepatology Pager: [phone]

## 2022-02-19 ENCOUNTER — Other Ambulatory Visit: Payer: Self-pay

## 2022-02-19 DIAGNOSIS — I8510 Secondary esophageal varices without bleeding: Secondary | ICD-10-CM

## 2022-02-19 DIAGNOSIS — K7031 Alcoholic cirrhosis of liver with ascites: Secondary | ICD-10-CM

## 2022-02-19 NOTE — Addendum Note (Signed)
Addended by: Roena Malady on: 02/19/2022 11:04 AM   Modules accepted: Orders

## 2022-02-20 NOTE — Telephone Encounter (Signed)
Bonita Quin, wife, is aware and expressed understanding... lab ordered, EGD ordered and repeat MRI ordered

## 2022-02-21 LAB — BASIC METABOLIC PANEL
BUN/Creatinine Ratio: 10 (ref 9–20)
BUN: 7 mg/dL (ref 6–24)
CO2: 21 mmol/L (ref 20–29)
Calcium: 8.3 mg/dL — ABNORMAL LOW (ref 8.7–10.2)
Chloride: 98 mmol/L (ref 96–106)
Creatinine, Ser: 0.7 mg/dL — ABNORMAL LOW (ref 0.76–1.27)
Glucose: 121 mg/dL — ABNORMAL HIGH (ref 70–99)
Potassium: 4 mmol/L (ref 3.5–5.2)
Sodium: 131 mmol/L — ABNORMAL LOW (ref 134–144)
eGFR: 107 mL/min/{1.73_m2} (ref >59)

## 2022-02-24 ENCOUNTER — Encounter: Payer: Self-pay | Admitting: Gastroenterology

## 2022-02-26 ENCOUNTER — Telehealth: Payer: Self-pay

## 2022-02-26 NOTE — Telephone Encounter (Signed)
Spoke with patient's wife-Linda and she stated that her husband is currently taking Spironolactone 150 mg daily and Lasix 40 mg daily. His weight has decreased since 02/05/2022. Today's weight is 186 lb. Bonita Quin stated that her husband had been complaining of abdominal pain and not hungry so he barely eats. Bonita Quin also stated that since he is not eating he had stayed constipated. He goes to the restroom once every 3 days. They wanted to know what he is able to do since he is scared to eat because of huis abdominal pain. Please advise.

## 2022-02-26 NOTE — Telephone Encounter (Signed)
1. What dose of aldactone and lasix is he on  2. What has his weight been over the past two weeks 3. Based on this inform can advice

## 2022-02-26 NOTE — Telephone Encounter (Signed)
(  Dr. Annabell Sabal pt) Patient wife called wanting to know if her husband needs to take more fluid medications or stay the same. Can you please advise.

## 2022-02-27 MED ORDER — LACTULOSE 20 G PO PACK: 20.0000 g | PACK | Freq: Three times a day (TID) | ORAL | 1 refills | Status: DC

## 2022-02-27 NOTE — Telephone Encounter (Signed)
Spoke to Cameron Shelton and explained what Dr. Tobi Bastos recommended and she agreed. I told her that the prescription was going to be sent to their pharmacy. She stated that she will go and pick it up once it was ready. Cameron Shelton had no further questions.

## 2022-03-02 MED ORDER — LACTULOSE 10 G PO PACK: 20.0000 g | PACK | Freq: Three times a day (TID) | ORAL | 0 refills | Status: DC

## 2022-03-02 NOTE — Telephone Encounter (Signed)
Lactulose 20g packet was too expensive and showed "not reimbursable", 10g packet showed that it was preferred... Rx for Lactulose 2-10g packet TID sent to pharmacy to see if it would be cheaper....  Pt wanted to know if a paracentesis could be ordered to remove some the fluid since he is not ble to increase medication at this time? Please advise

## 2022-03-02 NOTE — Addendum Note (Signed)
Addended by: Roena Malady on: 03/02/2022 10:11 AM   Modules accepted: Orders

## 2022-03-03 ENCOUNTER — Other Ambulatory Visit: Payer: Self-pay

## 2022-03-03 ENCOUNTER — Encounter: Payer: Self-pay | Admitting: Gastroenterology

## 2022-03-03 DIAGNOSIS — K7031 Alcoholic cirrhosis of liver with ascites: Secondary | ICD-10-CM

## 2022-03-03 MED ORDER — LACTULOSE 10 GM/15ML PO SOLN: 20.0000 g | Freq: Three times a day (TID) | ORAL | 1 refills | Status: DC

## 2022-03-03 NOTE — Telephone Encounter (Signed)
VO due to pt having dizziness low BP readings, do not double Rx at this time...   Paracentesis order faxed

## 2022-03-03 NOTE — Addendum Note (Signed)
Addended by: Roena Malady on: 03/03/2022 11:25 AM   Modules accepted: Orders

## 2022-03-03 NOTE — Telephone Encounter (Signed)
Lab and paracentesis ordered  Pt's wife Bonita Quin said pt has been experiencing intermittent dizziness...   Is pt okay to double both Rx due to the dizziness?   Just to confirm, this would be Spironolactone 300mg  and Lasix 80mg ?  Please advise   Also, Lactulose packet Rx ws not covered and too expensive, oral solution may be covered and cheaper for pt... Sent in the meantime to see if it will go through insurance

## 2022-03-05 ENCOUNTER — Ambulatory Visit
Admission: RE | Admit: 2022-03-05 | Discharge: 2022-03-05 | Disposition: A | Discharge status: Home / Self Care | Payer: 59 | Source: Ambulatory Visit | Attending: Gastroenterology | Admitting: Gastroenterology

## 2022-03-05 DIAGNOSIS — K7031 Alcoholic cirrhosis of liver with ascites: Secondary | ICD-10-CM | POA: Diagnosis present

## 2022-03-05 NOTE — Procedures (Signed)
PROCEDURE SUMMARY:  Successful ultrasound guided paracentesis from the RLQ Yielded 2.1 L of clear yellow fluid.  No immediate complications.  The patient tolerated the procedure well.   Specimen was not sent for labs.  EBL <  53mL  If the patient eventually requires >/=2 paracenteses in a 30 day period, screening evaluation by the [facility] will be assessed.   Berneta Levins, PA-C 03/05/2022, 2:29 PM

## 2022-03-12 ENCOUNTER — Encounter: Payer: Self-pay | Admitting: Gastroenterology

## 2022-03-12 ENCOUNTER — Ambulatory Visit: Payer: 59 | Admitting: Anesthesiology

## 2022-03-12 ENCOUNTER — Ambulatory Visit
Admission: RE | Admit: 2022-03-12 | Discharge: 2022-03-12 | Disposition: A | Discharge status: Home / Self Care | Payer: 59 | Attending: Gastroenterology | Admitting: Gastroenterology

## 2022-03-12 ENCOUNTER — Encounter
Admission: RE | Disposition: A | Discharge status: Home / Self Care | Payer: Self-pay | Source: Home / Self Care | Attending: Gastroenterology

## 2022-03-12 DIAGNOSIS — K2970 Gastritis, unspecified, without bleeding: Secondary | ICD-10-CM | POA: Diagnosis not present

## 2022-03-12 DIAGNOSIS — I10 Essential (primary) hypertension: Secondary | ICD-10-CM | POA: Diagnosis not present

## 2022-03-12 DIAGNOSIS — I8510 Secondary esophageal varices without bleeding: Secondary | ICD-10-CM | POA: Diagnosis not present

## 2022-03-12 DIAGNOSIS — K7460 Unspecified cirrhosis of liver: Secondary | ICD-10-CM | POA: Insufficient documentation

## 2022-03-12 DIAGNOSIS — K7031 Alcoholic cirrhosis of liver with ascites: Secondary | ICD-10-CM | POA: Diagnosis not present

## 2022-03-12 DIAGNOSIS — K2980 Duodenitis without bleeding: Secondary | ICD-10-CM | POA: Insufficient documentation

## 2022-03-12 DIAGNOSIS — B182 Chronic viral hepatitis C: Secondary | ICD-10-CM | POA: Insufficient documentation

## 2022-03-12 HISTORY — PX: ESOPHAGOGASTRODUODENOSCOPY (EGD) WITH PROPOFOL: SHX5813

## 2022-03-12 SURGERY — ESOPHAGOGASTRODUODENOSCOPY (EGD) WITH PROPOFOL
Anesthesia: General

## 2022-03-12 MED ORDER — PROPOFOL 10 MG/ML IV BOLUS
INTRAVENOUS | Status: AC
  Filled 2022-03-12: qty 40

## 2022-03-12 MED ORDER — PROPOFOL 10 MG/ML IV BOLUS
INTRAVENOUS | Status: DC | PRN
  Administered 2022-03-12: 100 mg via INTRAVENOUS
  Administered 2022-03-12: 120 ug/kg/min via INTRAVENOUS

## 2022-03-12 MED ORDER — LIDOCAINE HCL (CARDIAC) PF 100 MG/5ML IV SOSY
PREFILLED_SYRINGE | INTRAVENOUS | Status: DC | PRN
  Administered 2022-03-12: 100 mg via INTRAVENOUS

## 2022-03-12 MED ORDER — SODIUM CHLORIDE 0.9 % IV SOLN: INTRAVENOUS | Status: DC

## 2022-03-12 MED ORDER — PHENYLEPHRINE 80 MCG/ML (10ML) SYRINGE FOR IV PUSH (FOR BLOOD PRESSURE SUPPORT)
PREFILLED_SYRINGE | INTRAVENOUS | Status: AC
  Filled 2022-03-12: qty 10

## 2022-03-12 MED ORDER — LIDOCAINE HCL (PF) 2 % IJ SOLN
INTRAMUSCULAR | Status: AC
  Filled 2022-03-12: qty 5

## 2022-03-12 MED ORDER — PHENYLEPHRINE HCL (PRESSORS) 10 MG/ML IV SOLN
INTRAVENOUS | Status: DC | PRN
  Administered 2022-03-12: 160 ug via INTRAVENOUS

## 2022-03-12 NOTE — H&P (Signed)
Midge Minium, MD [facility] [address], [address] [place], [place] [address] Phone:[phone] Fax : [phone]  Primary Care Physician:  Dortha Kern, MD Primary Gastroenterologist:  Dr. Servando Snare  Pre-Procedure History & Physical: HPI:  Cameron Shelton is a 56 y.o. male is here for an endoscopy.   Past Medical History:  Diagnosis Date   Alcohol use disorder, severe, dependence (HCC)    Cirrhosis (HCC)    Eczema    Hepatitis C, chronic (HCC)    Hypertension    Psoriasis    Tobacco use disorder     Past Surgical History:  Procedure Laterality Date   ESOPHAGOGASTRODUODENOSCOPY (EGD) WITH PROPOFOL N/A 11/11/2021   Procedure: ESOPHAGOGASTRODUODENOSCOPY (EGD) WITH PROPOFOL;  Surgeon: Midge Minium, MD;  Location: [facility] ENDOSCOPY;  Service: Endoscopy;  Laterality: N/A;   ESOPHAGOGASTRODUODENOSCOPY (EGD) WITH PROPOFOL N/A 01/06/2022   Procedure: ESOPHAGOGASTRODUODENOSCOPY (EGD) WITH PROPOFOL;  Surgeon: Midge Minium, MD;  Location: [facility] ENDOSCOPY;  Service: Endoscopy;  Laterality: N/A;    Prior to Admission medications   Medication Sig Start Date End Date Taking? Authorizing Provider  amLODipine (NORVASC) 10 MG tablet Take 10 mg by mouth daily.   Yes [provider]  Apremilast (OTEZLA) 30 MG TABS Take 30 mg by mouth 2 (two) times daily.   Yes [provider]  escitalopram (LEXAPRO) 10 MG tablet Take 10 mg by mouth daily.   Yes [provider]  furosemide (LASIX) 40 MG tablet Take 1 tablet (40 mg total) by mouth daily. 07/24/21  Yes Lurene Shadow, MD  lactulose (CEPHULAC) 10 g packet Take 2 packets (20 g total) by mouth 3 (three) times daily. 03/02/22  Yes Wyline Mood, MD  lactulose (CHRONULAC) 10 GM/15ML solution Take 30 mLs (20 g total) by mouth 3 (three) times daily. 03/03/22  Yes Midge Minium, MD  Multiple Vitamins-Minerals (MULTIVITAMIN WITH MINERALS) tablet Take 1 tablet by mouth daily.   Yes [provider]  oxyCODONE (OXY IR/ROXICODONE) 5 MG  immediate release tablet Take 5 mg by mouth 3 (three) times daily as needed. 10/04/21  Yes [provider]  potassium chloride SA (KLOR-CON M) 20 MEQ tablet Take 20 mEq by mouth daily.   Yes [provider]  spironolactone (ALDACTONE) 100 MG tablet Take 1.5 tablets (150 mg total) by mouth daily. 02/05/22  Yes Midge Minium, MD    Allergies as of 02/19/2022   (No Known Allergies)    Family History  Problem Relation Age of Onset   Hypertension Father     Social History   Socioeconomic History   Marital status: Married    Spouse name: Not on file   Number of children: Not on file   Years of education: Not on file   Highest education level: Not on file  Occupational History   Not on file  Tobacco Use   Smoking status: Every Day    Packs/day: 0.50    Types: Cigarettes   Smokeless tobacco: Never  Vaping Use   Vaping Use: Never used  Substance and Sexual Activity   Alcohol use: Not Currently   Drug use: Not Currently   Sexual activity: Not Currently  Other Topics Concern   Not on file  Social History Narrative   Not on file   Social Determinants of Health   Financial Resource Strain: Not on file  Food Insecurity: Not on file  Transportation Needs: Not on file  Physical Activity: Not on file  Stress: Not on file  Social Connections: Not on file  Intimate Partner Violence: Not on file    Review of Systems: See HPI, otherwise negative ROS  Physical Exam: BP 105/70   Pulse (!) 57   Temp 97.6 F (36.4 C) (Temporal)   Resp 18   Ht 5\' 7"  (1.702 m)   Wt 85 kg   SpO2 100%   BMI 29.35 kg/m  General:   Alert,  pleasant and cooperative in NAD Head:  Normocephalic and atraumatic. Neck:  Supple; no masses or thyromegaly. Lungs:  Clear throughout to auscultation.    Heart:  Regular rate and rhythm. Abdomen:  Soft, nontender and nondistended. Normal bowel sounds, without guarding, and without rebound.   Neurologic:  Alert and  oriented x4;  grossly normal  neurologically.  Impression/Plan: Cameron Shelton is here for an endoscopy to be performed for cirrhosis  Risks, benefits, limitations, and alternatives regarding  endoscopy have been reviewed with the patient.  Questions have been answered.  All parties agreeable.   Lyndle Herrlich, MD  03/12/2022, 7:16 AM

## 2022-03-12 NOTE — Anesthesia Preprocedure Evaluation (Signed)
Anesthesia Evaluation  Patient identified by MRN, date of birth, ID band Patient awake    Reviewed: Allergy & Precautions, NPO status , Patient's Chart, lab work & pertinent test results  History of Anesthesia Complications Negative for: history of anesthetic complications  Airway Mallampati: III  TM Distance: >3 FB Neck ROM: Full    Dental  (+) Edentulous Upper, Edentulous Lower   Pulmonary neg sleep apnea, neg COPD, Current SmokerPatient did not abstain from smoking.,    Pulmonary exam normal breath sounds clear to auscultation       Cardiovascular Exercise Tolerance: Good METShypertension, (-) CAD and (-) Past MI (-) dysrhythmias  Rhythm:Regular Rate:Normal - Systolic murmurs    Neuro/Psych negative neurological ROS  negative psych ROS   GI/Hepatic neg GERD  ,(+) Cirrhosis   Esophageal Varices and ascites  (-) substance abuse  , Hepatitis -Ascites 2L drained within past week   Endo/Other  neg diabetes  Renal/GU negative Renal ROS     Musculoskeletal   Abdominal   Peds  Hematology   Anesthesia Other Findings Past Medical History: No date: Alcohol use disorder, severe, dependence (HCC) No date: Cirrhosis (HCC) No date: Eczema No date: Hepatitis C, chronic (HCC) No date: Hypertension No date: Psoriasis No date: Tobacco use disorder  Reproductive/Obstetrics                             Anesthesia Physical Anesthesia Plan  ASA: 3  Anesthesia Plan: General   Post-op Pain Management: Minimal or no pain anticipated   Induction: Intravenous  PONV Risk Score and Plan: 1 and Propofol infusion, TIVA and Ondansetron  Airway Management Planned: Nasal Cannula  Additional Equipment: None  Intra-op Plan:   Post-operative Plan:   Informed Consent: I have reviewed the patients History and Physical, chart, labs and discussed the procedure including the risks, benefits and alternatives  for the proposed anesthesia with the patient or authorized representative who has indicated his/her understanding and acceptance.     Dental advisory given  Plan Discussed with: CRNA and Surgeon  Anesthesia Plan Comments: (Discussed risks of anesthesia with patient, including possibility of difficulty with spontaneous ventilation under anesthesia necessitating airway intervention, PONV, and rare risks such as cardiac or respiratory or neurological events, and allergic reactions. Discussed the role of CRNA in patient's perioperative care. Patient understands. Patient counseled on benefits of smoking cessation, and increased perioperative risks associated with continued smoking. )        Anesthesia Quick Evaluation

## 2022-03-12 NOTE — Transfer of Care (Signed)
Immediate Anesthesia Transfer of Care Note  Patient: Cameron Shelton  Procedure(s) Performed: ESOPHAGOGASTRODUODENOSCOPY (EGD) WITH PROPOFOL  Patient Location: PACU  Anesthesia Type:General  Level of Consciousness: drowsy  Airway & Oxygen Therapy: Patient Spontanous Breathing and Patient connected to face mask oxygen  Post-op Assessment: Report given to RN and Post -op Vital signs reviewed and stable  Post vital signs: Reviewed and stable  Last Vitals:  Vitals Value Taken Time  BP 125/78 03/12/22 0746  Temp 35.9 0736  Pulse 59 03/12/22 0747  Resp 18 03/12/22 0747  SpO2 98 % 03/12/22 0747  Vitals shown include unvalidated device data.  Last Pain:  Vitals:   03/12/22 0746  TempSrc:   PainSc: 0-No pain         Complications: No notable events documented.

## 2022-03-12 NOTE — Op Note (Addendum)
[facility] Gastroenterology Patient Name: Cameron Shelton Procedure Date: 03/12/2022 7:17 AM MRN: [id number] Account #: [id number] Date of Birth: 03-31-1966 Admit Type: Outpatient Age: 56 Room: [facility] ENDO ROOM 3 Gender: Male Note Status: Finalized Instrument Name: Upper Endoscope [id number] Procedure:             Upper GI endoscopy Indications:           Follow-up of esophageal varices Providers:             Midge Minium MD, MD Referring MD:          Dortha Kern (Referring MD) Medicines:             Propofol per Anesthesia Complications:         No immediate complications. Procedure:             Pre-Anesthesia Assessment:                        - Prior to the procedure, a History and Physical was                         performed, and patient medications and allergies were                         reviewed. The patient's tolerance of previous                         anesthesia was also reviewed. The risks and benefits                         of the procedure and the sedation options and risks                         were discussed with the patient. All questions were                         answered, and informed consent was obtained. Prior                         Anticoagulants: The patient has taken no previous                         anticoagulant or antiplatelet agents. ASA Grade                         Assessment: II - A patient with mild systemic disease.                         After reviewing the risks and benefits, the patient                         was deemed in satisfactory condition to undergo the                         procedure.                        After obtaining informed consent, the endoscope was  passed under direct vision. Throughout the procedure,                         the patient's blood pressure, pulse, and oxygen                         saturations were monitored continuously. The Endoscope                          was introduced through the mouth, and advanced to the                         second part of duodenum. The upper GI endoscopy was                         accomplished without difficulty. The patient tolerated                         the procedure well. Findings:      Grade II varices were found in the lower third of the esophagus. Two       bands were successfully placed with complete eradication, resulting in       deflation of varices.      Mild inflammation was found in the entire examined stomach.      Mild inflammation characterized by erythema was found in the entire       duodenum. Impression:            - Grade II esophageal varices. Completely eradicated.                         Banded.                        - Gastritis.                        - Duodenitis.                        - No specimens collected. Recommendation:        - Discharge patient to home.                        - Resume previous diet.                        - Continue present medications. Procedure Code(s):     --- Professional ---                        [phone], Esophagogastroduodenoscopy, flexible,                         transoral; with band ligation of esophageal/gastric                         varices Diagnosis Code(s):     --- Professional ---                        I85.00, Esophageal varices without bleeding  K29.70, Gastritis, unspecified, without bleeding CPT copyright 2019 American Medical Association. All rights reserved. The codes documented in this report are preliminary and upon coder review may  be revised to meet current compliance requirements. Lucilla Lame MD, MD 03/12/2022 7:44:31 AM This report has been signed electronically. Number of Addenda: 0 Note Initiated On: 03/12/2022 7:17 AM Estimated Blood Loss:  Estimated blood loss: none.      [facility]

## 2022-03-12 NOTE — Anesthesia Postprocedure Evaluation (Signed)
Anesthesia Post Note  Patient: Cameron Shelton  Procedure(s) Performed: ESOPHAGOGASTRODUODENOSCOPY (EGD) WITH PROPOFOL  Patient location during evaluation: Endoscopy Anesthesia Type: General Level of consciousness: awake and alert Pain management: pain level controlled Vital Signs Assessment: post-procedure vital signs reviewed and stable Respiratory status: spontaneous breathing, nonlabored ventilation, respiratory function stable and patient connected to nasal cannula oxygen Cardiovascular status: blood pressure returned to baseline and stable Postop Assessment: no apparent nausea or vomiting Anesthetic complications: no   No notable events documented.   Last Vitals:  Vitals:   03/12/22 0746 03/12/22 0757  BP: 125/78   Pulse:  62  Resp:    Temp: (!) 36.2 C   SpO2:  99%    Last Pain:  Vitals:   03/12/22 0753  TempSrc:   PainSc: 0-No pain                 Corinda Gubler

## 2022-03-13 ENCOUNTER — Encounter: Payer: Self-pay | Admitting: Gastroenterology

## 2022-04-08 ENCOUNTER — Telehealth: Payer: Self-pay | Admitting: Gastroenterology

## 2022-04-08 NOTE — Telephone Encounter (Signed)
Medical records for D.O.S for dates of service 06/17/2021 to present to [email or web address] Ph: [phone] fax : [phone]

## 2022-04-23 ENCOUNTER — Ambulatory Visit (INDEPENDENT_AMBULATORY_CARE_PROVIDER_SITE_OTHER): Payer: 59 | Admitting: Gastroenterology

## 2022-04-23 ENCOUNTER — Encounter: Payer: Self-pay | Admitting: Gastroenterology

## 2022-04-23 VITALS — BP 100/67 | HR 72 | Temp 97.9°F | Wt 188.0 lb

## 2022-04-23 DIAGNOSIS — R772 Abnormality of alphafetoprotein: Secondary | ICD-10-CM

## 2022-04-23 NOTE — Progress Notes (Signed)
Primary Care Physician: Lynnell Jude, MD  Primary Gastroenterologist:  Dr. Lucilla Lame  Chief Complaint  Patient presents with   Follow-up    HPI: Cameron Shelton is a 56 y.o. male here who comes here with a history of cirrhosis and has been treated for his hep C.  The patient had a recent EGD that showed grade 2 esophageal varices that were banded and eradicated.  The patient also had a MRI back in July that showed innumerable hyperenhancing lesions in the liver with a recommendation of follow-up with an MRI.  The patient was recommended to have a repeat MRI in 3 months. The patient reports that he only moves his bowels approximately twice a week.  He does not like to take the lactulose.  There is no report of any black stools or bloody stools.  Past Medical History:  Diagnosis Date   Alcohol use disorder, severe, dependence (HCC)    Cirrhosis (HCC)    Eczema    Hepatitis C, chronic (HCC)    Hypertension    Psoriasis    Tobacco use disorder     Current Outpatient Medications  Medication Sig Dispense Refill   amLODipine (NORVASC) 10 MG tablet Take 10 mg by mouth daily.     Apremilast (OTEZLA) 30 MG TABS Take 30 mg by mouth 2 (two) times daily.     escitalopram (LEXAPRO) 10 MG tablet Take 10 mg by mouth daily.     furosemide (LASIX) 40 MG tablet Take 1 tablet (40 mg total) by mouth daily. 30 tablet 0   lactulose (CEPHULAC) 10 g packet Take 2 packets (20 g total) by mouth 3 (three) times daily. 180 each 0   lactulose (CHRONULAC) 10 GM/15ML solution Take 30 mLs (20 g total) by mouth 3 (three) times daily. 2700 mL 1   Multiple Vitamins-Minerals (MULTIVITAMIN WITH MINERALS) tablet Take 1 tablet by mouth daily.     oxyCODONE (OXY IR/ROXICODONE) 5 MG immediate release tablet Take 5 mg by mouth 3 (three) times daily as needed.     potassium chloride SA (KLOR-CON M) 20 MEQ tablet Take 20 mEq by mouth daily.     spironolactone (ALDACTONE) 100 MG tablet Take 1.5 tablets (150 mg total)  by mouth daily. 135 tablet 1   No current facility-administered medications for this visit.    Allergies as of 04/23/2022   (No Known Allergies)    ROS:  General: Negative for anorexia, weight loss, fever, chills, fatigue, weakness. ENT: Negative for hoarseness, difficulty swallowing , nasal congestion. CV: Negative for chest pain, angina, palpitations, dyspnea on exertion, peripheral edema.  Respiratory: Negative for dyspnea at rest, dyspnea on exertion, cough, sputum, wheezing.  GI: See history of present illness. GU:  Negative for dysuria, hematuria, urinary incontinence, urinary frequency, nocturnal urination.  Endo: Negative for unusual weight change.    Physical Examination:   BP 100/67   Pulse 72   Temp 97.9 F (36.6 C) (Oral)   Wt 188 lb (85.3 kg)   BMI 29.44 kg/m   General: Well-nourished, well-developed in no acute distress.  Eyes: No icterus. Conjunctivae pink. Neuro: Alert and oriented x 3.  Grossly intact. Skin: Warm and dry, no jaundice.   Psych: Alert and cooperative, normal mood and affect.  Labs:    Imaging Studies: No results found.  Assessment and Plan:   Cameron Shelton is a 56 y.o. y/o male who comes in with a history of hepatitis C cirrhosis.  The patient was sent  to [facility] for treatment of his otitis C but never followed up with them.  The patient wanted to wait till the MRI and liver lesions were sorted out.  The patient will have his alpha-fetoprotein checked again and is awaiting his appointment for his liver MRI.  The patient will try to take MiraLAX or other laxatives that he states he already has at home so that he is having 2 formed bowel movements daily to decrease the risk of hepatic encephalopathy.  The patient has been explained the plan agrees with it.     Lucilla Lame, MD. Marval Regal    Note: This dictation was prepared with Dragon dictation along with smaller phrase technology. Any transcriptional errors that result from this process are  unintentional.

## 2022-04-24 LAB — AFP TUMOR MARKER: AFP, Serum, Tumor Marker: 20.3 ng/mL — ABNORMAL HIGH (ref 0.0–8.4)

## 2022-04-27 ENCOUNTER — Encounter: Payer: Self-pay | Admitting: Gastroenterology

## 2022-05-25 ENCOUNTER — Ambulatory Visit
Admission: RE | Admit: 2022-05-25 | Discharge: 2022-05-25 | Disposition: A | Discharge status: Home / Self Care | Payer: 59 | Source: Ambulatory Visit | Attending: Gastroenterology | Admitting: Gastroenterology

## 2022-05-25 DIAGNOSIS — K7031 Alcoholic cirrhosis of liver with ascites: Secondary | ICD-10-CM | POA: Insufficient documentation

## 2022-05-25 MED ORDER — GADOBUTROL 1 MMOL/ML IV SOLN
7.5000 mL | Freq: Once | INTRAVENOUS | Status: AC | PRN
  Administered 2022-05-25: 7.5 mL via INTRAVENOUS

## 2022-05-27 ENCOUNTER — Encounter: Payer: Self-pay | Admitting: Gastroenterology

## 2022-07-15 ENCOUNTER — Telehealth: Payer: Self-pay | Admitting: Gastroenterology

## 2022-07-15 NOTE — Telephone Encounter (Signed)
Pt medical records from 08/03/2020 to present were sent to [facility] [employer] [facility] mailed on 07/15/2022  [address] [place] [id number]

## 2022-07-27 ENCOUNTER — Encounter: Payer: Self-pay | Admitting: Gastroenterology

## 2022-07-28 MED ORDER — SPIRONOLACTONE 100 MG PO TABS: 150.0000 mg | ORAL_TABLET | Freq: Every day | ORAL | 0 refills | Status: DC

## 2022-07-28 NOTE — Addendum Note (Signed)
Addended by: Roena Malady on: 07/28/2022 11:19 AM   Modules accepted: Orders

## 2022-09-03 ENCOUNTER — Encounter: Payer: Self-pay | Admitting: Gastroenterology

## 2022-09-03 DIAGNOSIS — K7460 Unspecified cirrhosis of liver: Secondary | ICD-10-CM

## 2022-09-22 ENCOUNTER — Telehealth: Payer: Self-pay | Admitting: Gastroenterology

## 2022-09-22 NOTE — Telephone Encounter (Signed)
Pt medical records were sent to [facility] [id number] [place] [id number]

## 2022-09-24 NOTE — Addendum Note (Signed)
Addended by: Lurlean Nanny on: 09/24/2022 05:04 PM   Modules accepted: Orders

## 2022-09-24 NOTE — Telephone Encounter (Signed)
Faxed records to [phone]

## 2022-09-30 ENCOUNTER — Telehealth: Payer: Self-pay | Admitting: Gastroenterology

## 2022-09-30 NOTE — Telephone Encounter (Signed)
Pt medical records were mailed to [email or web address] on 09/30/2022

## 2022-10-01 ENCOUNTER — Ambulatory Visit
Admission: RE | Admit: 2022-10-01 | Discharge: 2022-10-01 | Disposition: A | Discharge status: Home / Self Care | Payer: 59 | Source: Ambulatory Visit | Attending: Gastroenterology | Admitting: Gastroenterology

## 2022-10-01 ENCOUNTER — Other Ambulatory Visit: Payer: Self-pay | Admitting: Gastroenterology

## 2022-10-01 DIAGNOSIS — K7460 Unspecified cirrhosis of liver: Secondary | ICD-10-CM | POA: Diagnosis present

## 2022-10-01 MED ORDER — GADOBUTROL 1 MMOL/ML IV SOLN
9.0000 mL | Freq: Once | INTRAVENOUS | Status: AC | PRN
  Administered 2022-10-01: 9 mL via INTRAVENOUS

## 2022-10-26 ENCOUNTER — Encounter: Payer: Self-pay | Admitting: Gastroenterology

## 2022-10-26 DIAGNOSIS — K7031 Alcoholic cirrhosis of liver with ascites: Secondary | ICD-10-CM

## 2022-10-26 DIAGNOSIS — R772 Abnormality of alphafetoprotein: Secondary | ICD-10-CM

## 2022-10-26 NOTE — Addendum Note (Signed)
Addended by: Lurlean Nanny on: 10/26/2022 09:14 AM   Modules accepted: Orders

## 2022-11-10 ENCOUNTER — Other Ambulatory Visit: Payer: Self-pay | Admitting: Gastroenterology

## 2022-11-10 MED ORDER — SPIRONOLACTONE 100 MG PO TABS: 150.0000 mg | ORAL_TABLET | Freq: Every day | ORAL | 0 refills | Status: DC

## 2022-11-23 ENCOUNTER — Encounter: Payer: Self-pay | Admitting: Gastroenterology

## 2022-11-23 DIAGNOSIS — K7031 Alcoholic cirrhosis of liver with ascites: Secondary | ICD-10-CM

## 2022-11-24 NOTE — Addendum Note (Signed)
Addended by: Roena Malady on: 11/24/2022 09:50 AM   Modules accepted: Orders

## 2022-12-12 LAB — BASIC METABOLIC PANEL
BUN/Creatinine Ratio: 11 (ref 9–20)
BUN: 9 mg/dL (ref 6–24)
CO2: 22 mmol/L (ref 20–29)
Calcium: 8.9 mg/dL (ref 8.7–10.2)
Chloride: 101 mmol/L (ref 96–106)
Creatinine, Ser: 0.84 mg/dL (ref 0.76–1.27)
Glucose: 145 mg/dL — ABNORMAL HIGH (ref 70–99)
Potassium: 3.8 mmol/L (ref 3.5–5.2)
Sodium: 136 mmol/L (ref 134–144)
eGFR: 101 mL/min/{1.73_m2} (ref >59)

## 2022-12-15 ENCOUNTER — Encounter: Payer: Self-pay | Admitting: Gastroenterology

## 2023-02-17 ENCOUNTER — Other Ambulatory Visit: Payer: Self-pay | Admitting: Gastroenterology

## 2023-04-14 ENCOUNTER — Telehealth: Payer: Self-pay

## 2023-04-14 NOTE — Telephone Encounter (Signed)
Patient has never scheduled his repeat MRI liver w wo contrast. Please contact patient if you want patient have this done. This was order 10/26/2022

## 2023-05-23 IMAGING — US US PARACENTESIS
1 series · 7 of 7 positions shown · non-contrast
Comparison: none

INDICATION: Recurrent ascites request received for paracentesis.

[Series 1: us paracentesis · 0.26mm/px · 7 of 7 slices shown]
[im 1/7]
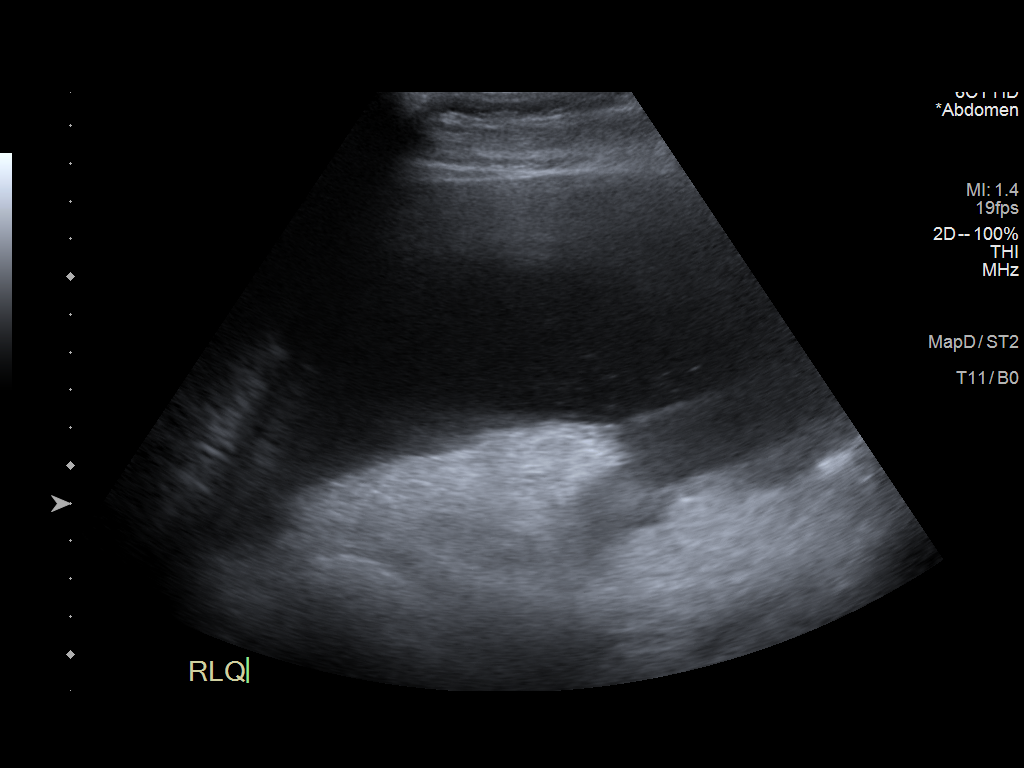
[im 2/7]
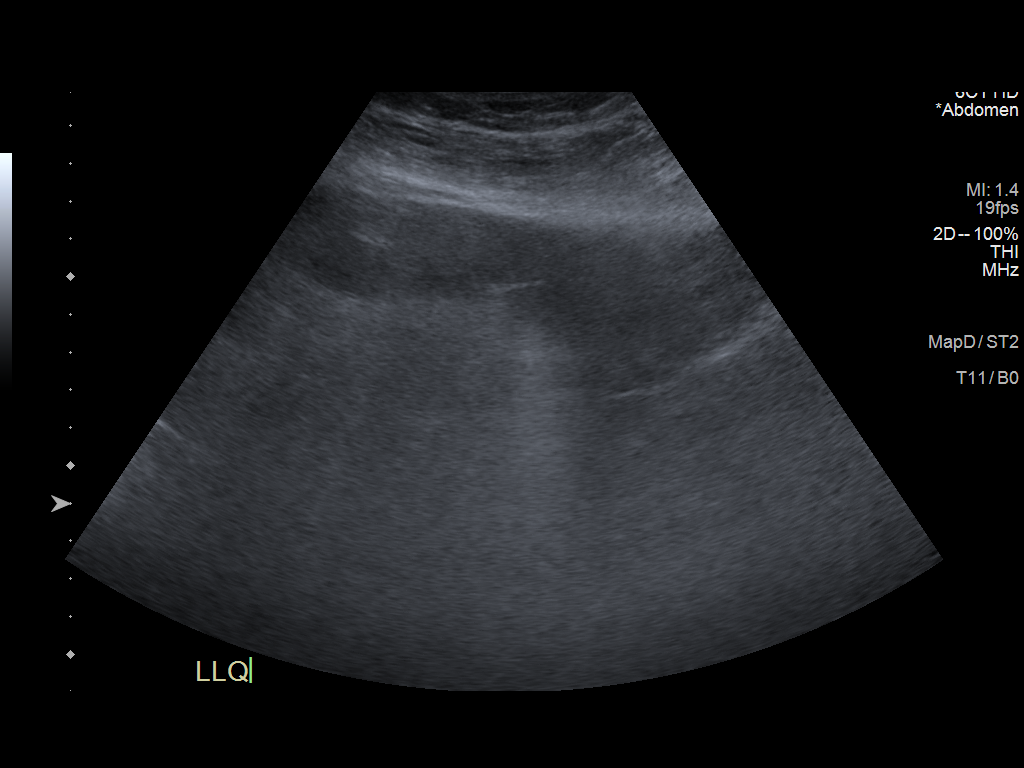
[im 3/7]
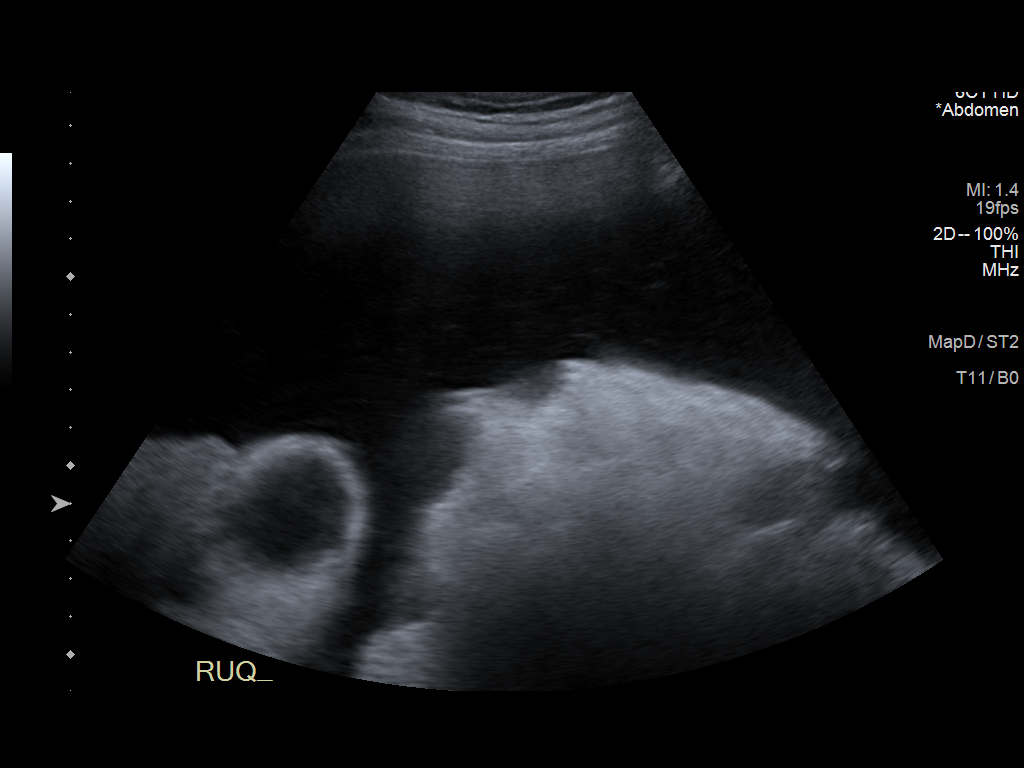
[im 4/7]
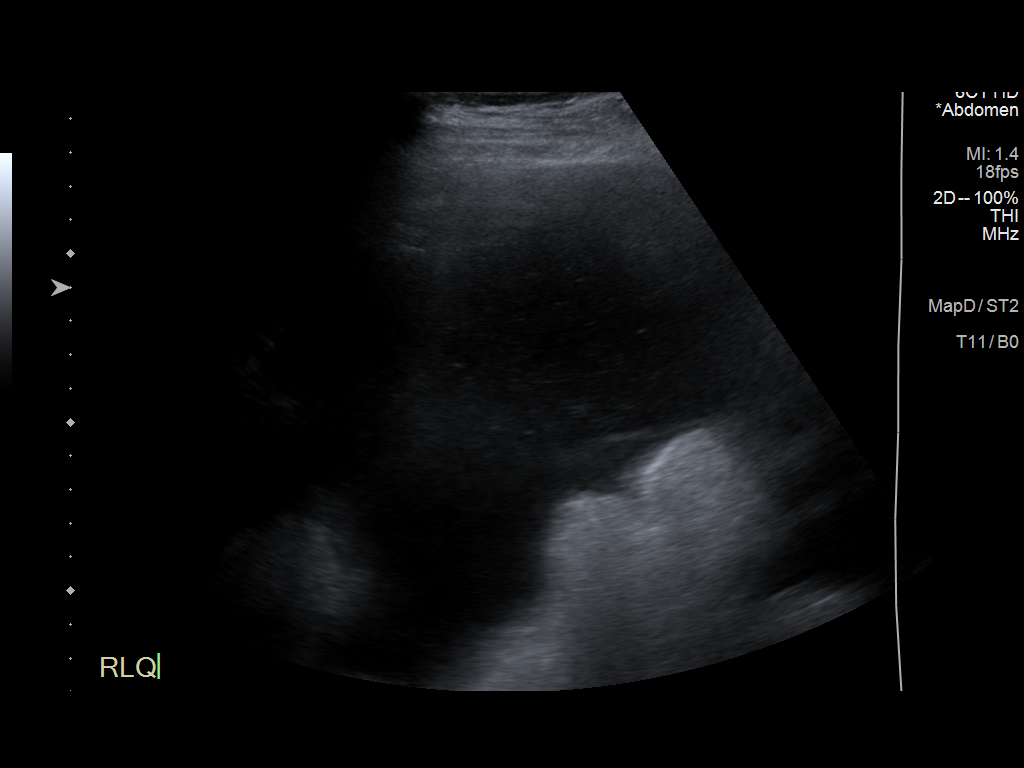
[im 5/7]
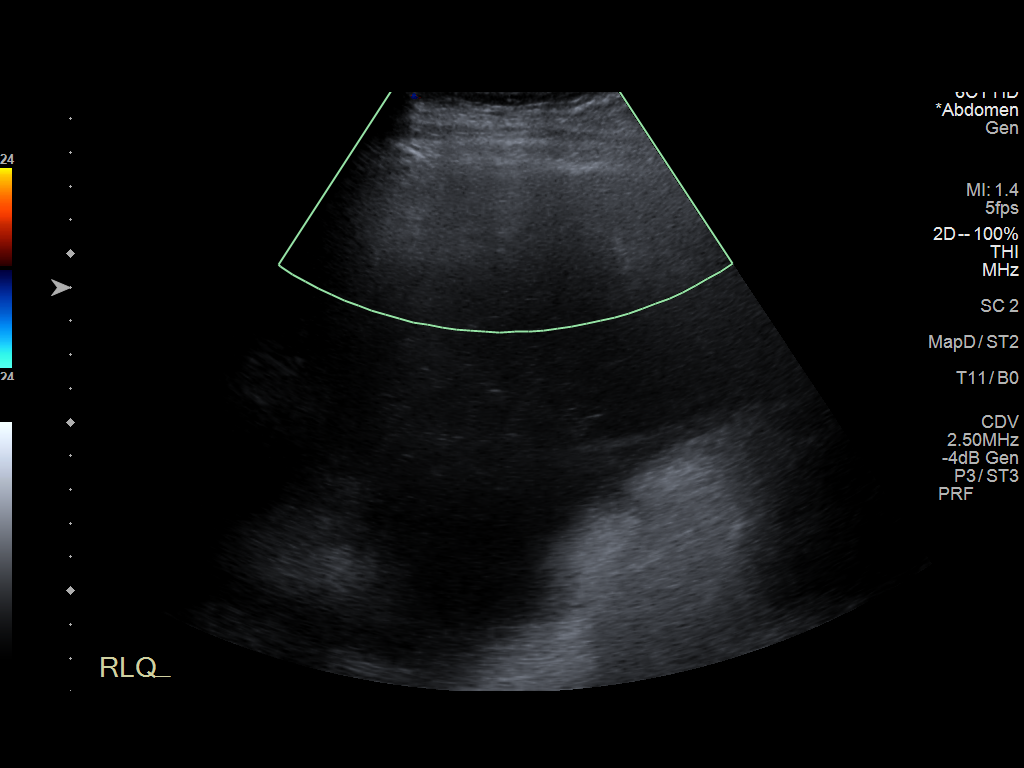
[im 6/7]
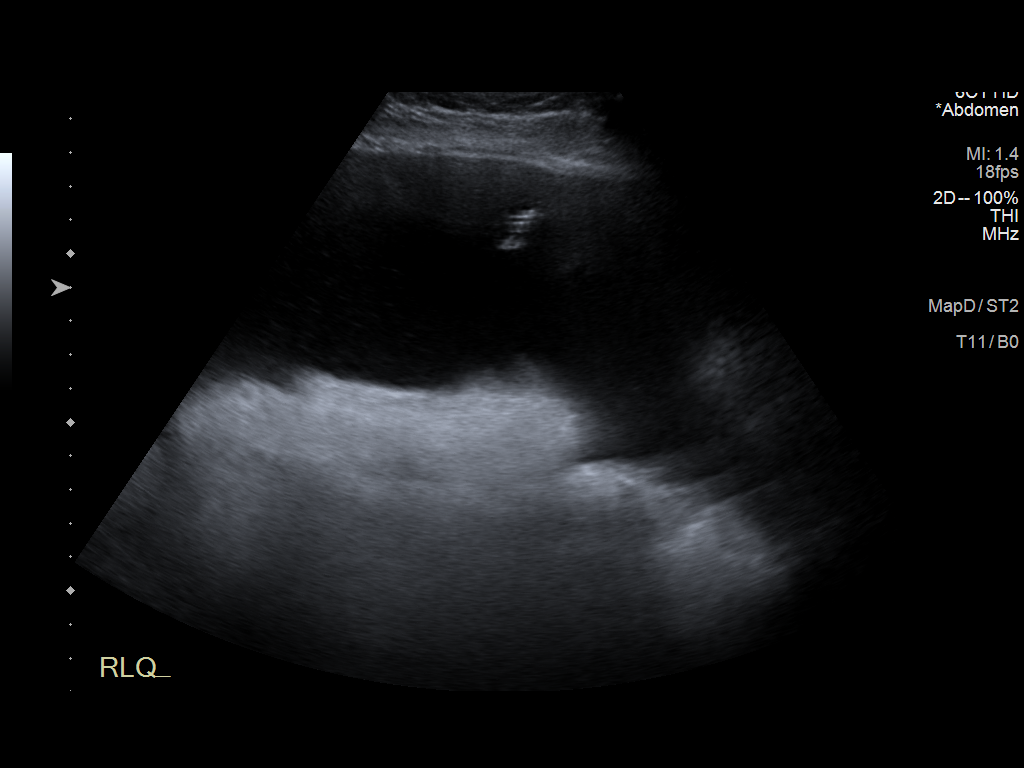
[im 7/7]
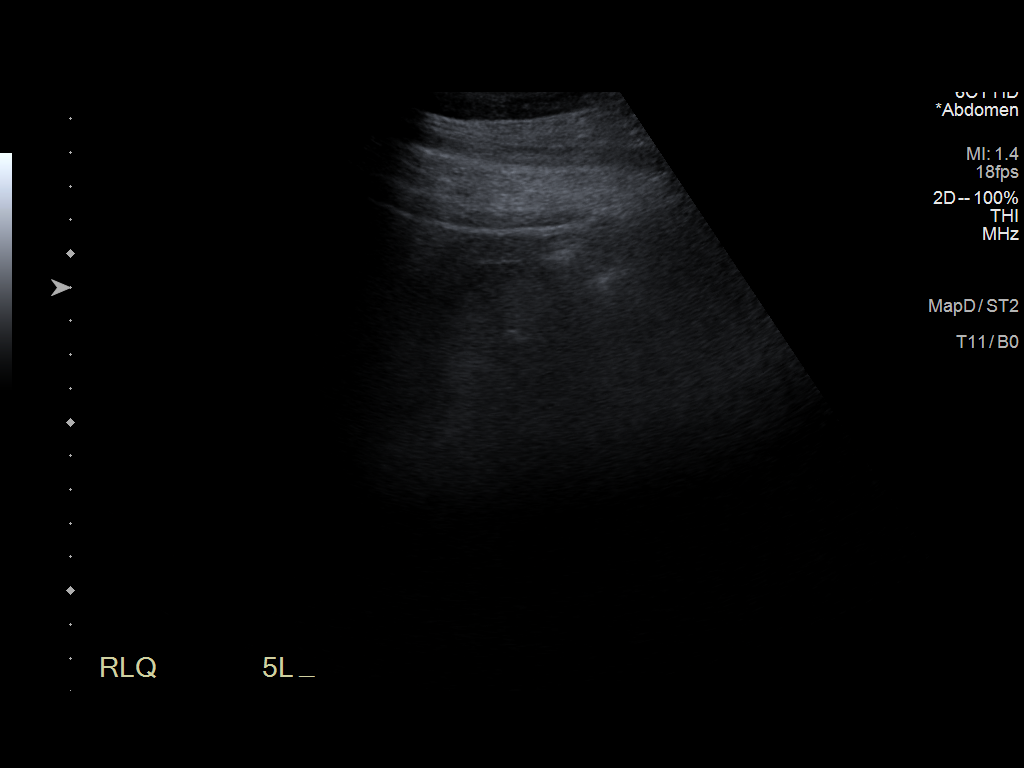

[7 of 7 positions shown; findings below may reference images not displayed]

EXAM:
ULTRASOUND GUIDED  PARACENTESIS

MEDICATIONS:
Local 1% lidocaine only.

COMPLICATIONS:
None immediate.

PROCEDURE:
Informed written consent was obtained from the patient after a
discussion of the risks, benefits and alternatives to treatment. A
timeout was performed prior to the initiation of the procedure.

Initial ultrasound scanning demonstrates a large amount of ascites
within the right lower abdominal quadrant. The right lower abdomen
was prepped and draped in the usual sterile fashion. 1% lidocaine
was used for local anesthesia.

Following this, a 19 gauge, 7-cm, Yueh catheter was introduced. An
ultrasound image was saved for documentation purposes. The
paracentesis was performed. The catheter was removed and a dressing
was applied. The patient tolerated the procedure well without
immediate post procedural complication.
FINDINGS: A total of approximately 5 L of clear yellow fluid was removed.
IMPRESSION: Successful ultrasound-guided paracentesis yielding 5 liters of
peritoneal fluid.

This exam was performed by Ambiorix Speakman, and was supervised
and interpreted by Dr. Nonkes.

## 2023-05-24 IMAGING — US US ABDOMEN LIMITED
1 series · 8 of 8 positions shown · non-contrast
Comparison: None.

CLINICAL DATA: 35-year-old gentleman with recurrent ascites
presents to IR for paracentesis.

EXAM:
LIMITED ABDOMEN ULTRASOUND FOR ASCITES
TECHNIQUE: Limited ultrasound survey for ascites was performed in all four
abdominal quadrants.

[Series 1: us paracentesis · 8 of 8 slices shown]
[im 1/8]
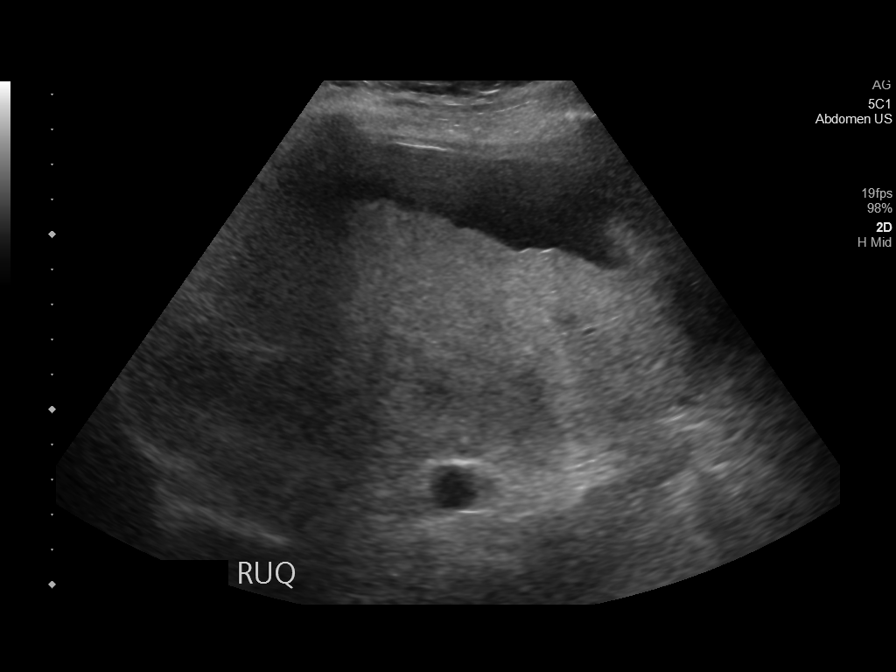
[im 2/8]
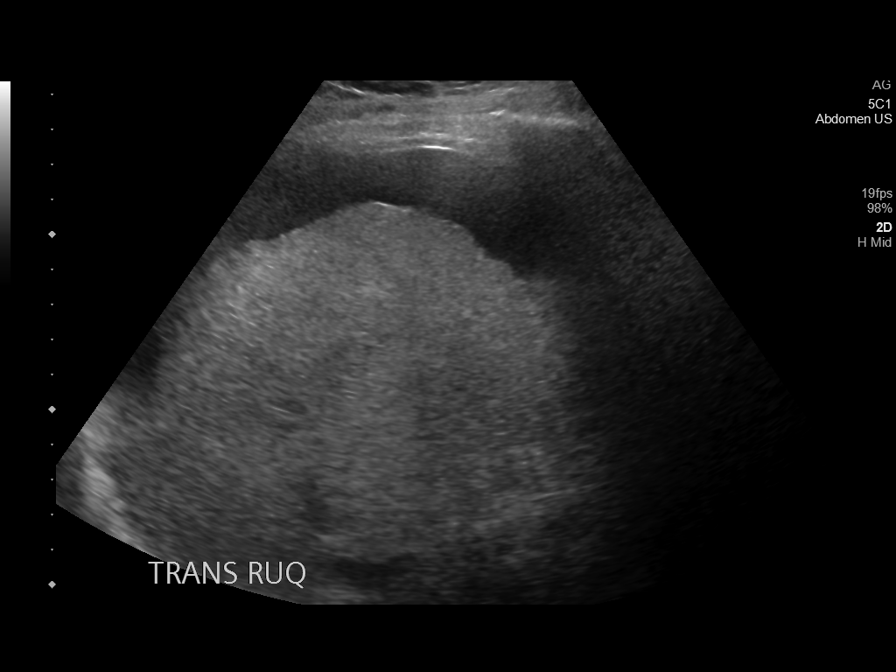
[im 3/8]
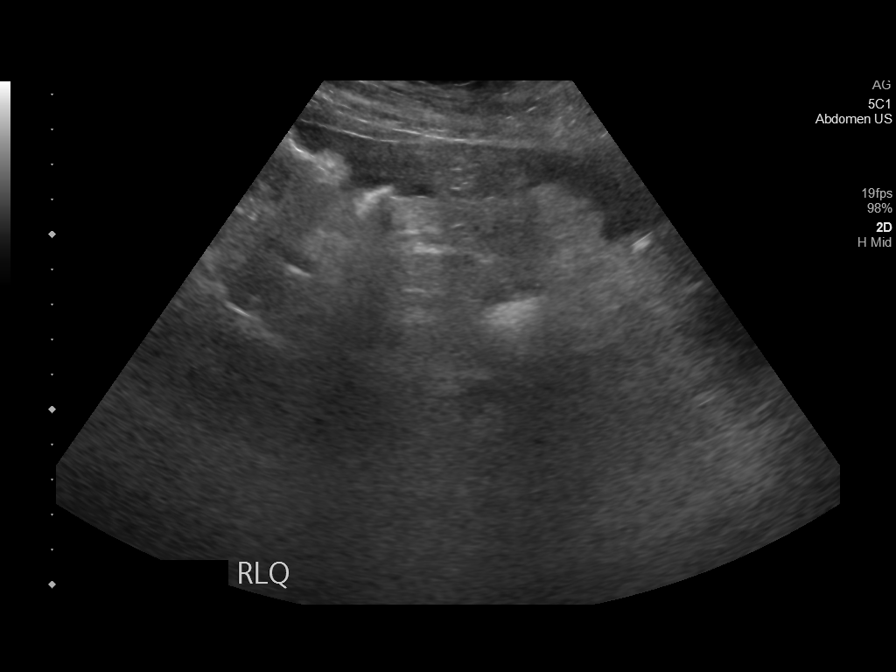
[im 4/8]
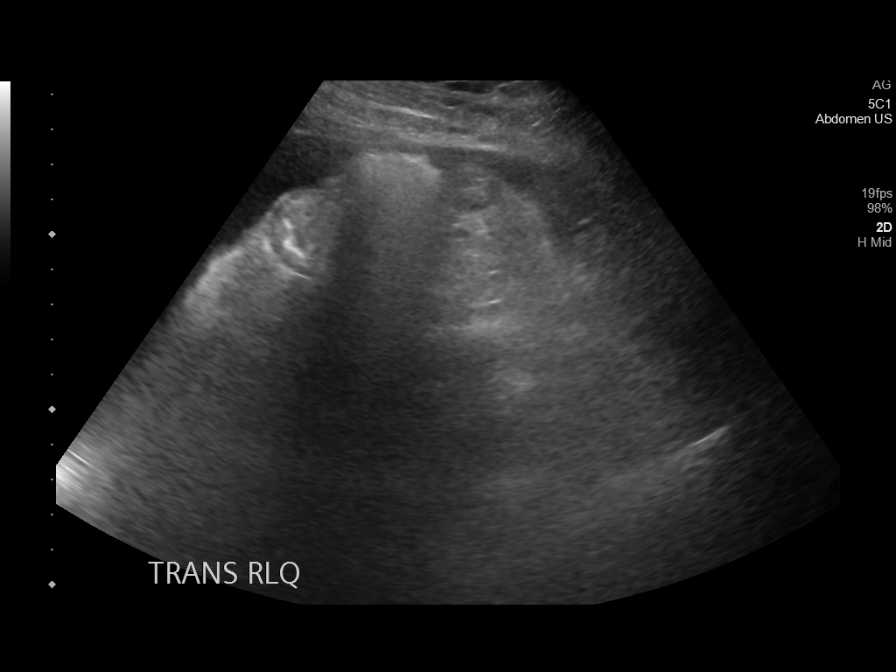
[im 5/8]
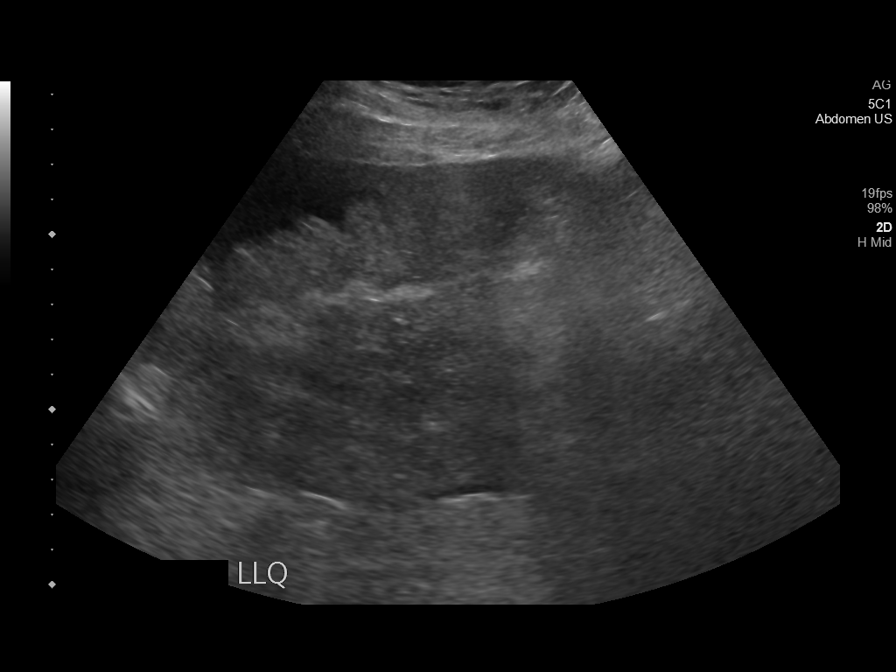
[im 6/8]
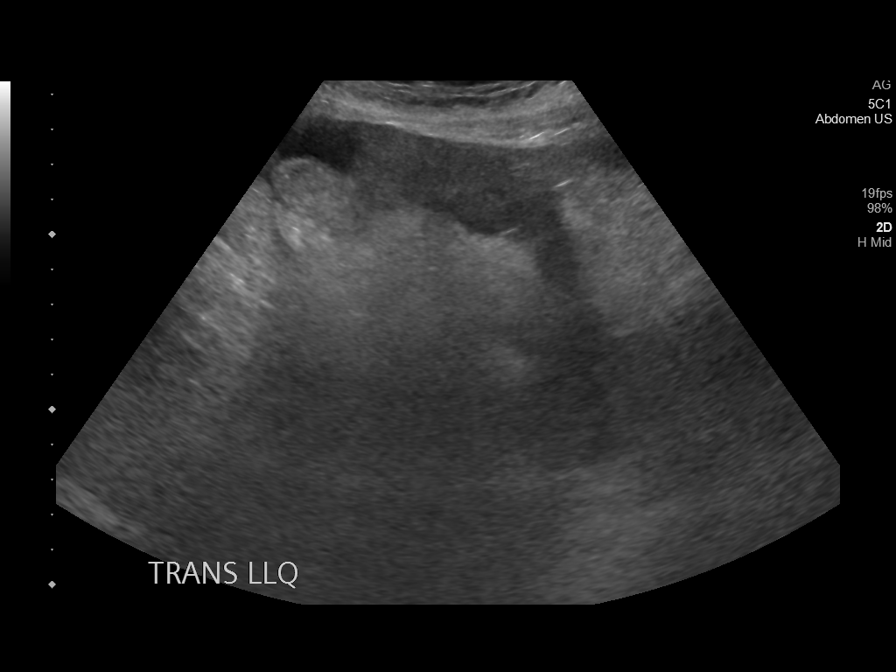
[im 7/8]
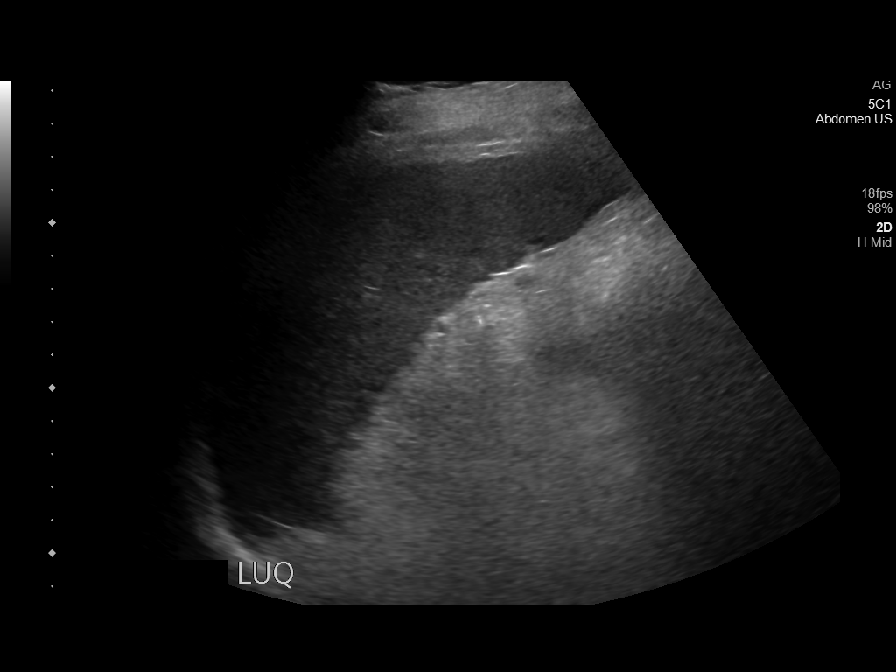
[im 8/8]
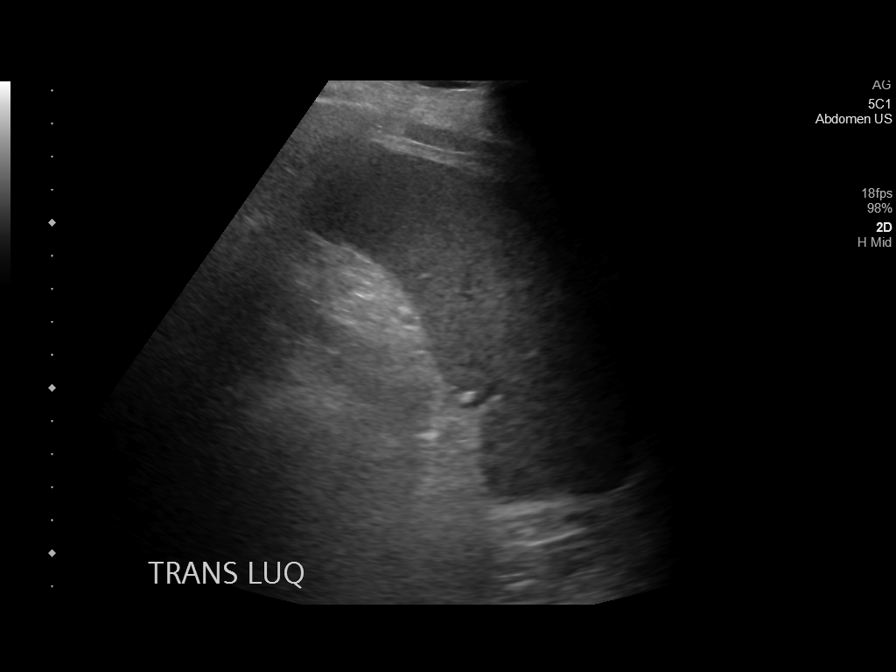

[8 of 8 positions shown; findings below may reference images not displayed]

FINDINGS: Ultrasound evaluation of the abdomen demonstrates minimal ascites.
IMPRESSION: Minimal abdominal ascites, insufficient for paracentesis.

## 2023-06-01 ENCOUNTER — Other Ambulatory Visit: Payer: Self-pay | Admitting: Gastroenterology

## 2023-06-01 MED ORDER — SPIRONOLACTONE 100 MG PO TABS: 150.0000 mg | ORAL_TABLET | Freq: Every day | ORAL | 0 refills | Status: DC

## 2023-07-22 ENCOUNTER — Ambulatory Visit (INDEPENDENT_AMBULATORY_CARE_PROVIDER_SITE_OTHER): Payer: BLUE CROSS/BLUE SHIELD | Admitting: Gastroenterology

## 2023-07-22 VITALS — BP 143/79 | HR 91 | Temp 98.0°F | Wt 268.0 lb

## 2023-07-22 DIAGNOSIS — Z8719 Personal history of other diseases of the digestive system: Secondary | ICD-10-CM | POA: Diagnosis not present

## 2023-07-22 DIAGNOSIS — R9389 Abnormal findings on diagnostic imaging of other specified body structures: Secondary | ICD-10-CM

## 2023-07-22 DIAGNOSIS — K7031 Alcoholic cirrhosis of liver with ascites: Secondary | ICD-10-CM

## 2023-07-22 NOTE — Patient Instructions (Signed)
Please call to schedule MRI at [phone]

## 2023-07-22 NOTE — Progress Notes (Signed)
Primary Care Physician: Dortha Kern, MD  Primary Gastroenterologist:  Dr. Midge Minium  Chief Complaint  Patient presents with   Follow-up    HPI: Cameron Shelton is a 57 y.o. male here who comes in today with a history of cirrhosis.  The patient has a history of hepatitis C.  His wife does state that he has some intermittent swelling and is presently on diuretics.  The patient states that he still drinks occasionally.  There is no report of any black stools or bloody stools.  The patient also denies any diarrhea or constipation.  The wife says that there is some times when he is a bit forgetful but no overt signs of hepatic encephalopathy.  The patient has gained weight and looks a lot better than he had in the past. The patient had an MRI back in the beginning of this year with multiple abnormal findings.  The patient was recommended to have a repeat MRI to see if these lesions had changed but the patient states he was not contacted about getting the MRI done.  Our notes show that he was contacted multiple times and his wife stated that he refused at that time to undergo the MRI.  Past Medical History:  Diagnosis Date   Alcohol use disorder, severe, dependence (HCC)    Cirrhosis (HCC)    Eczema    Hepatitis C, chronic (HCC)    Hypertension    Psoriasis    Tobacco use disorder     Current Outpatient Medications  Medication Sig Dispense Refill   amLODipine (NORVASC) 10 MG tablet Take 10 mg by mouth daily.     Apremilast (OTEZLA) 30 MG TABS Take 30 mg by mouth 2 (two) times daily.     escitalopram (LEXAPRO) 10 MG tablet Take 10 mg by mouth daily.     furosemide (LASIX) 40 MG tablet Take 1 tablet (40 mg total) by mouth daily. 30 tablet 0   lactulose (CEPHULAC) 10 g packet Take 2 packets (20 g total) by mouth 3 (three) times daily. 180 each 0   lactulose (CHRONULAC) 10 GM/15ML solution Take 30 mLs (20 g total) by mouth 3 (three) times daily. 2700 mL 1   Multiple Vitamins-Minerals  (MULTIVITAMIN WITH MINERALS) tablet Take 1 tablet by mouth daily.     oxyCODONE (OXY IR/ROXICODONE) 5 MG immediate release tablet Take 5 mg by mouth 3 (three) times daily as needed.     potassium chloride SA (KLOR-CON M) 20 MEQ tablet Take 20 mEq by mouth daily.     spironolactone (ALDACTONE) 100 MG tablet Take 1.5 tablets (150 mg total) by mouth daily. 135 tablet 0   No current facility-administered medications for this visit.    Allergies as of 07/22/2023   (No Known Allergies)    ROS:  General: Negative for anorexia, weight loss, fever, chills, fatigue, weakness. ENT: Negative for hoarseness, difficulty swallowing , nasal congestion. CV: Negative for chest pain, angina, palpitations, dyspnea on exertion, peripheral edema.  Respiratory: Negative for dyspnea at rest, dyspnea on exertion, cough, sputum, wheezing.  GI: See history of present illness. GU:  Negative for dysuria, hematuria, urinary incontinence, urinary frequency, nocturnal urination.  Endo: Negative for unusual weight change.    Physical Examination:   BP (!) 143/79 (BP Location: Left Arm, Patient Position: Sitting, Cuff Size: Large)   Pulse 91   Temp 98 F (36.7 C) (Oral)   Wt 268 lb (121.6 kg)   BMI 41.97 kg/m   General:  Well-nourished, well-developed in no acute distress.  Eyes: No icterus. Conjunctivae pink. Lungs: Clear to auscultation bilaterally. Non-labored. Heart: Regular rate and rhythm, no murmurs rubs or gallops.  Abdomen: Bowel sounds are normal, nontender, nondistended, no hepatosplenomegaly or masses, no abdominal bruits or hernia , no rebound or guarding.   Extremities: No lower extremity edema. No clubbing or deformities. Neuro: Alert and oriented x 3.  Grossly intact. Skin: Warm and dry, no jaundice.   Psych: Alert and cooperative, normal mood and affect.  Labs:    Imaging Studies: No results found.  Assessment and Plan:   Cameron Shelton is a 57 y.o. y/o male who comes in today with a  history of cirrhosis and abnormal MRI findings on his last MRI.  These were Lad 3 lesions and was recommended to have a repeat MRI in 3 months.  He has not done that therefore I have set him up for repeat MRI to be ordered today.  The patient will also double up on his Lasix when he has any increased swelling.  The patient has been explained the plan and agrees with it.     Midge Minium, MD. Clementeen Graham    Note: This dictation was prepared with Dragon dictation along with smaller phrase technology. Any transcriptional errors that result from this process are unintentional.

## 2023-07-23 ENCOUNTER — Encounter: Payer: Self-pay | Admitting: Gastroenterology

## 2023-07-23 LAB — COMPREHENSIVE METABOLIC PANEL
ALT: 32 [IU]/L (ref 0–44)
AST: 69 [IU]/L — ABNORMAL HIGH (ref 0–40)
Albumin: 3.1 g/dL — ABNORMAL LOW (ref 3.8–4.9)
Alkaline Phosphatase: 158 [IU]/L — ABNORMAL HIGH (ref 44–121)
BUN/Creatinine Ratio: 8 — ABNORMAL LOW (ref 9–20)
BUN: 8 mg/dL (ref 6–24)
Bilirubin Total: 1.3 mg/dL — ABNORMAL HIGH (ref 0.0–1.2)
CO2: 22 mmol/L (ref 20–29)
Calcium: 8.7 mg/dL (ref 8.7–10.2)
Chloride: 99 mmol/L (ref 96–106)
Creatinine, Ser: 1.02 mg/dL (ref 0.76–1.27)
Globulin, Total: 4.2 g/dL (ref 1.5–4.5)
Glucose: 121 mg/dL — ABNORMAL HIGH (ref 70–99)
Potassium: 4.4 mmol/L (ref 3.5–5.2)
Sodium: 136 mmol/L (ref 134–144)
Total Protein: 7.3 g/dL (ref 6.0–8.5)
eGFR: 85 mL/min/{1.73_m2} (ref >59)

## 2023-07-23 LAB — AFP TUMOR MARKER: AFP, Serum, Tumor Marker: 11.7 ng/mL — ABNORMAL HIGH (ref 0.0–8.4)

## 2023-07-29 ENCOUNTER — Encounter: Payer: Self-pay | Admitting: Gastroenterology

## 2023-08-25 ENCOUNTER — Other Ambulatory Visit: Payer: Self-pay | Admitting: Gastroenterology

## 2023-09-25 IMAGING — US US PARACENTESIS
1 series · 9 of 9 positions shown · non-contrast
Comparison: none

INDICATION: Patient with history of cirrhosis and recurrent ascites request
received for therapeutic paracentesis.

[Series 1: us paracentesis · 0.26mm/px · 9 of 9 slices shown]
[im 1/9]
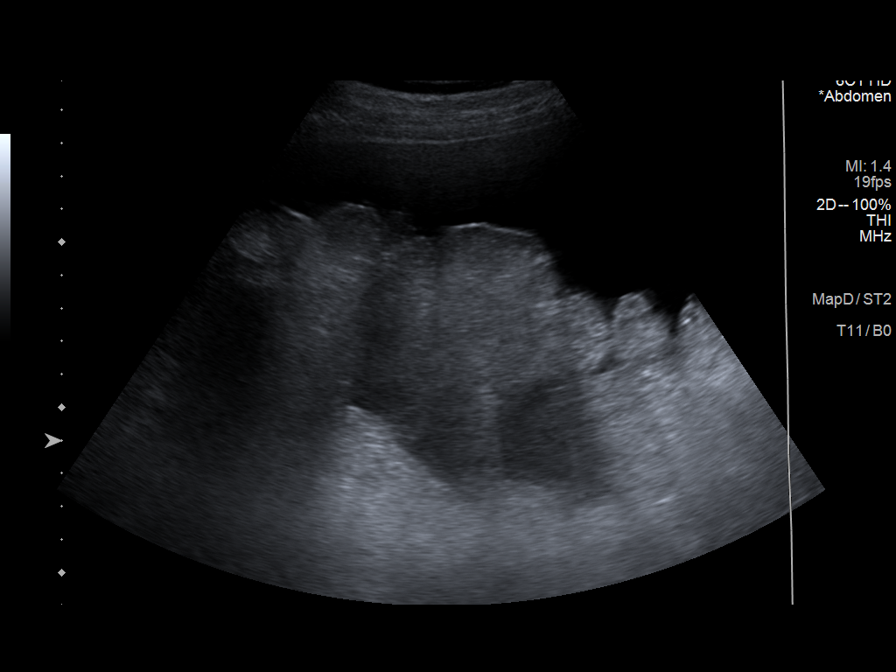
[im 2/9]
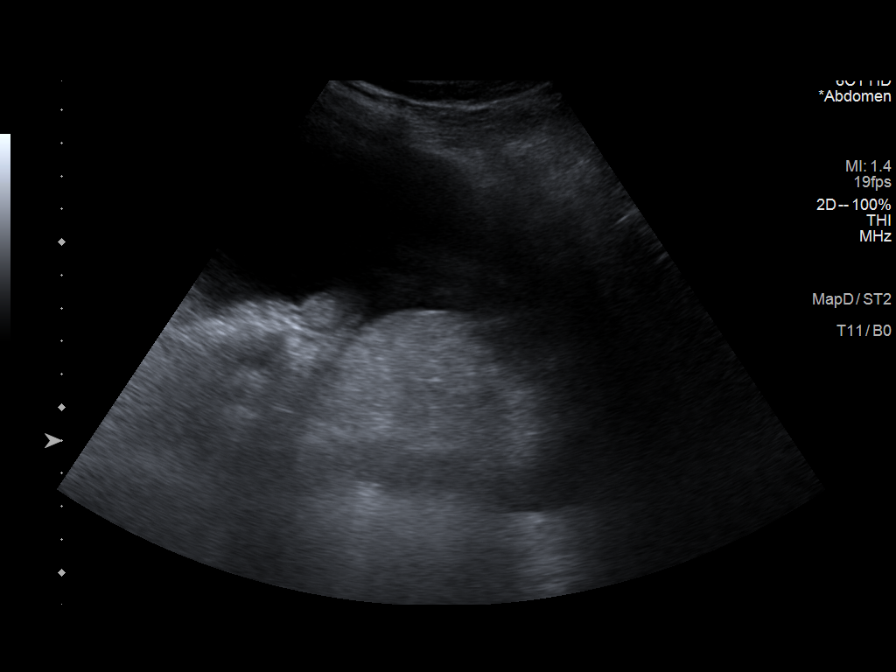
[im 3/9]
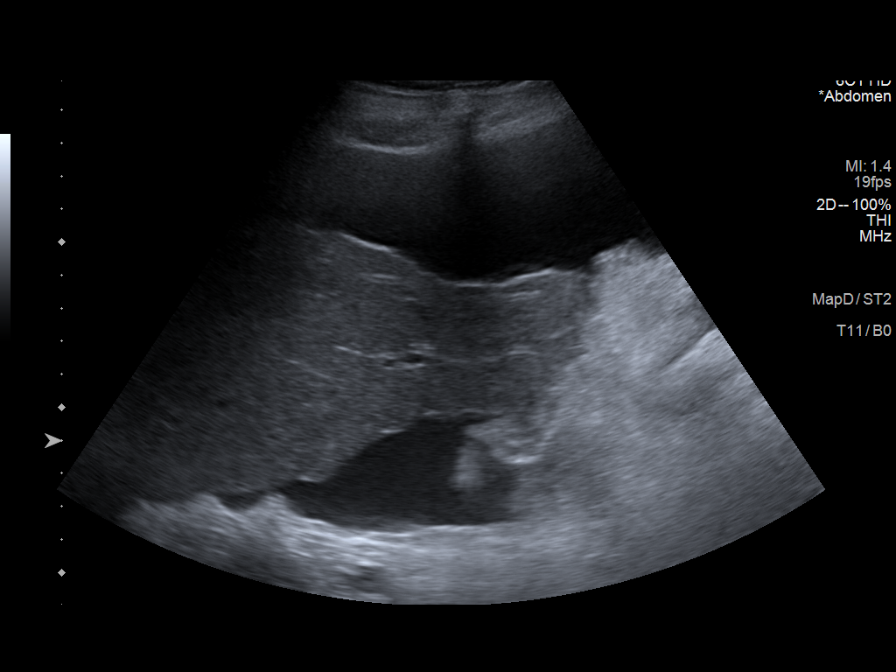
[im 4/9]
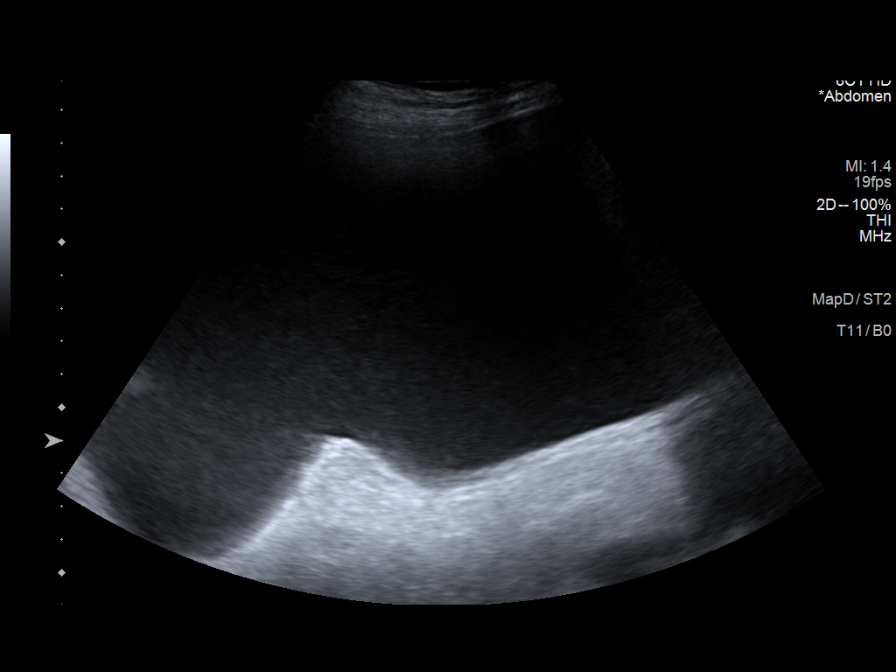
[im 5/9]
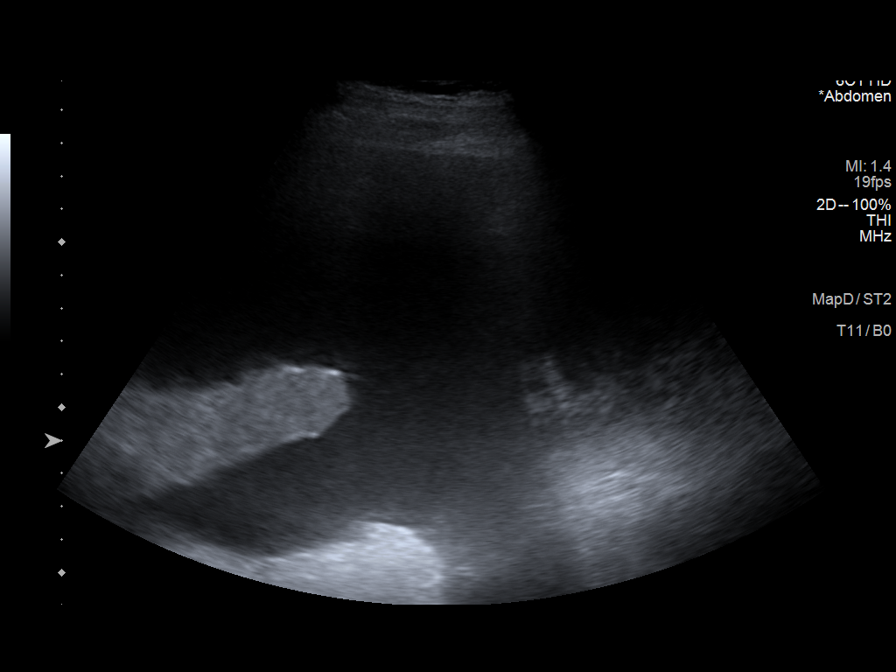
[im 6/9]
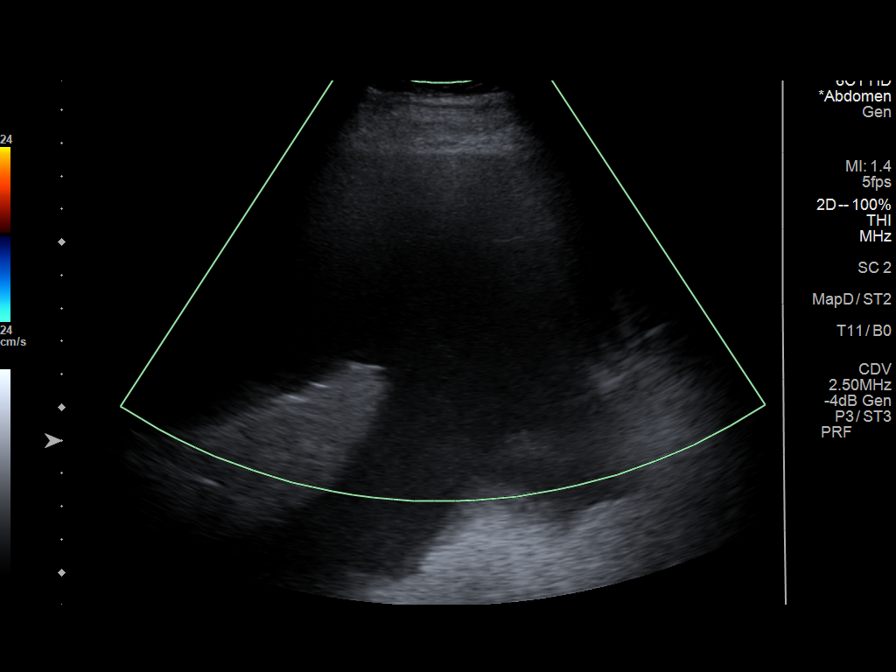
[im 7/9]
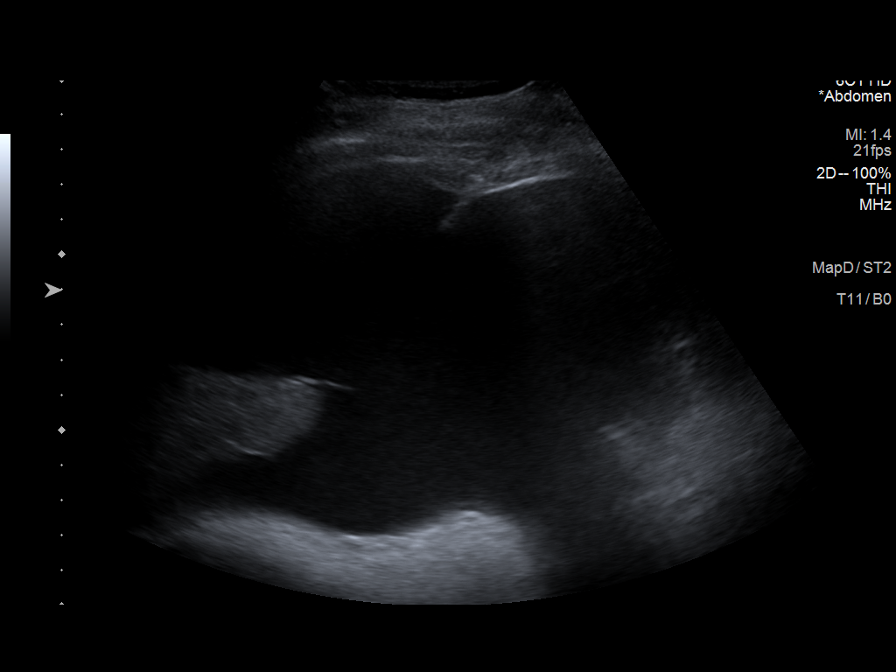
[im 8/9]
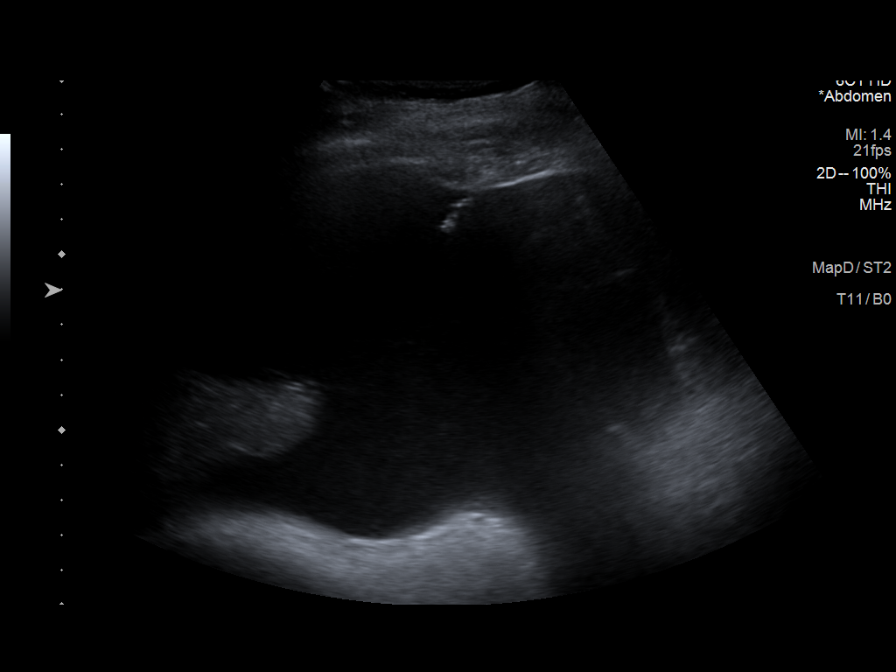
[im 9/9]
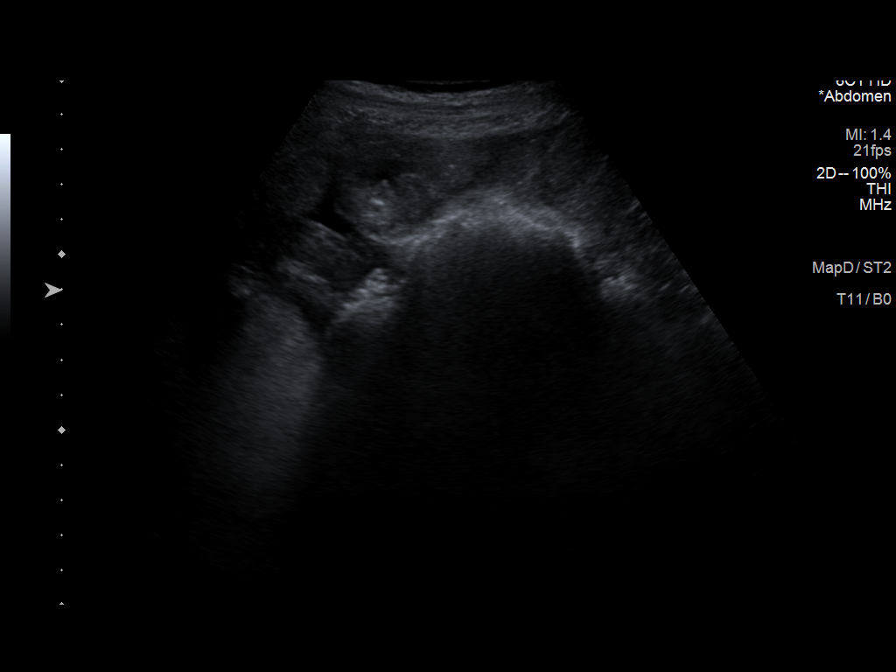

[9 of 9 positions shown; findings below may reference images not displayed]

EXAM:
ULTRASOUND GUIDED  PARACENTESIS

MEDICATIONS:
Local 1% lidocaine only.

COMPLICATIONS:
None immediate.

PROCEDURE:
Informed written consent was obtained from the patient after a
discussion of the risks, benefits and alternatives to treatment. A
timeout was performed prior to the initiation of the procedure.

Initial ultrasound scanning demonstrates a large amount of ascites
within the right lower abdominal quadrant. The right lower abdomen
was prepped and draped in the usual sterile fashion. 1% lidocaine
was used for local anesthesia.

Following this, a 19 gauge, 7-cm, Yueh catheter was introduced. An
ultrasound image was saved for documentation purposes. The
paracentesis was performed. The catheter was removed and a dressing
was applied. The patient tolerated the procedure well without
immediate post procedural complication.
FINDINGS: A total of approximately 6.8 L of clear yellow fluid was removed.
IMPRESSION: Successful ultrasound-guided paracentesis yielding 6.8 liters of
peritoneal fluid.

PLAN:
If the patient eventually requires >/=2 paracenteses in a 30 day
period, candidacy for formal evaluation by the [HOSPITAL]
assessed.

This exam was performed by Monisha Meas, and was supervised
and interpreted by Dr. Fitch.

## 2023-12-16 ENCOUNTER — Emergency Department
Admission: EM | Admit: 2023-12-16 | Discharge: 2023-12-16 | Disposition: A | Discharge status: Home / Self Care | Attending: Emergency Medicine | Admitting: Emergency Medicine

## 2023-12-16 ENCOUNTER — Other Ambulatory Visit: Payer: Self-pay

## 2023-12-16 ENCOUNTER — Emergency Department

## 2023-12-16 ENCOUNTER — Encounter: Payer: Self-pay | Admitting: Emergency Medicine

## 2023-12-16 DIAGNOSIS — K7031 Alcoholic cirrhosis of liver with ascites: Secondary | ICD-10-CM | POA: Insufficient documentation

## 2023-12-16 DIAGNOSIS — R109 Unspecified abdominal pain: Secondary | ICD-10-CM | POA: Diagnosis present

## 2023-12-16 LAB — URINALYSIS, ROUTINE W REFLEX MICROSCOPIC
Bacteria, UA: NONE SEEN
Bilirubin Urine: NEGATIVE
Glucose, UA: NEGATIVE mg/dL
Hgb urine dipstick: NEGATIVE
Ketones, ur: NEGATIVE mg/dL
Nitrite: NEGATIVE
Protein, ur: NEGATIVE mg/dL
Specific Gravity, Urine: 1.004 — ABNORMAL LOW (ref 1.005–1.030)
Squamous Epithelial / HPF: 0 /HPF (ref 0–5)
pH: 8 (ref 5.0–8.0)

## 2023-12-16 LAB — COMPREHENSIVE METABOLIC PANEL WITH GFR
ALT: 50 U/L — ABNORMAL HIGH (ref 0–44)
AST: 92 U/L — ABNORMAL HIGH (ref 15–41)
Albumin: 2.1 g/dL — ABNORMAL LOW (ref 3.5–5.0)
Alkaline Phosphatase: 107 U/L (ref 38–126)
Anion gap: 8 (ref 5–15)
BUN: 8 mg/dL (ref 6–20)
CO2: 26 mmol/L (ref 22–32)
Calcium: 7.9 mg/dL — ABNORMAL LOW (ref 8.9–10.3)
Chloride: 94 mmol/L — ABNORMAL LOW (ref 98–111)
Creatinine, Ser: 0.86 mg/dL (ref 0.61–1.24)
GFR, Estimated: >60 mL/min (ref >60)
Glucose, Bld: 142 mg/dL — ABNORMAL HIGH (ref 70–99)
Potassium: 3.5 mmol/L (ref 3.5–5.1)
Sodium: 128 mmol/L — ABNORMAL LOW (ref 135–145)
Total Bilirubin: 3 mg/dL — ABNORMAL HIGH (ref 0.0–1.2)
Total Protein: 6.6 g/dL (ref 6.5–8.1)

## 2023-12-16 LAB — CBC WITH DIFFERENTIAL/PLATELET
Abs Immature Granulocytes: 0.14 10*3/uL — ABNORMAL HIGH (ref 0.00–0.07)
Basophils Absolute: 0.1 10*3/uL (ref 0.0–0.1)
Basophils Relative: 2 %
Eosinophils Absolute: 0.3 10*3/uL (ref 0.0–0.5)
Eosinophils Relative: 3 %
HCT: 36.8 % — ABNORMAL LOW (ref 39.0–52.0)
Hemoglobin: 13.2 g/dL (ref 13.0–17.0)
Immature Granulocytes: 2 %
Lymphocytes Relative: 18 %
Lymphs Abs: 1.7 10*3/uL (ref 0.7–4.0)
MCH: 36.7 pg — ABNORMAL HIGH (ref 26.0–34.0)
MCHC: 35.9 g/dL (ref 30.0–36.0)
MCV: 102.2 fL — ABNORMAL HIGH (ref 80.0–100.0)
Monocytes Absolute: 1.2 10*3/uL — ABNORMAL HIGH (ref 0.1–1.0)
Monocytes Relative: 12 %
Neutro Abs: 6.1 10*3/uL (ref 1.7–7.7)
Neutrophils Relative %: 63 %
Platelets: 88 10*3/uL — ABNORMAL LOW (ref 150–400)
RBC: 3.6 MIL/uL — ABNORMAL LOW (ref 4.22–5.81)
RDW: 14.5 % (ref 11.5–15.5)
WBC: 9.6 10*3/uL (ref 4.0–10.5)
nRBC: 0 % (ref 0.0–0.2)

## 2023-12-16 LAB — ETHANOL: Alcohol, Ethyl (B): <15 mg/dL (ref <15)

## 2023-12-16 LAB — LIPASE, BLOOD: Lipase: 43 U/L (ref 11–51)

## 2023-12-16 LAB — AMMONIA: Ammonia: 81 umol/L — ABNORMAL HIGH (ref 9–35)

## 2023-12-16 MED ORDER — LIDOCAINE HCL (PF) 1 % IJ SOLN
10.0000 mL | Freq: Once | INTRAMUSCULAR | Status: AC
  Administered 2023-12-16: 10 mL via INTRADERMAL

## 2023-12-16 NOTE — Procedures (Signed)
 PROCEDURE SUMMARY:  Successful image-guided paracentesis from the right lower abdomen.  Yielded 1.75 liters of straw colored fluid.  No immediate complications.  EBL: trace Patient tolerated well.   Specimen not sent for labs.  Please see imaging section of Epic for full dictation.  Damian Duke Gen Clagg PA-C 12/16/2023 11:34 AM

## 2023-12-16 NOTE — ED Notes (Signed)
Pt taken to US for paracentesis

## 2023-12-16 NOTE — Discharge Instructions (Addendum)
 Please follow up with gastroenterology about who will be taking over your care from Dr. Ole Berkeley

## 2023-12-16 NOTE — ED Notes (Signed)
 Pt ambulatory to the restroom without assistance, urine sample obtained and sent to lab.

## 2023-12-16 NOTE — ED Triage Notes (Addendum)
 Patient ambulatory to triage with steady gait, without difficulty or distress noted; st sent over by PCP for persistent ascites; last 7 days having increased swelling and general abd pain; last ETOH yesterday

## 2023-12-16 NOTE — ED Provider Notes (Signed)
 [facility] Provider Note   Event Date/Time   First MD Initiated Contact with Patient 12/16/23 0700     (approximate) History  Abdominal Pain  HPI Cameron Shelton is a 58 y.o. male with a past medical history of cirrhosis who presents complaining of worsening ascites.  Patient was sent by his primary care physician with concerns for increasing swelling in his abdomen with no plan for outpatient paracentesis.  Patient states that he has had 1 paracentesis in the past due to volume overload however he has been attempting to control this increased fluid with increase in his diuretics.  Patient denies any persistent abdominal pain.   ROS: Patient currently denies any vision changes, tinnitus, difficulty speaking, facial droop, sore throat, chest pain, shortness of breath, abdominal pain, nausea/vomiting/diarrhea, dysuria, or weakness/numbness/paresthesias in any extremity   Physical Exam  Triage Vital Signs: ED Triage Vitals  Encounter Vitals Group     BP 12/16/23 0655 123/78     Systolic BP Percentile --      Diastolic BP Percentile --      Pulse Rate 12/16/23 0655 79     Resp 12/16/23 0655 17     Temp 12/16/23 0655 97.9 F (36.6 C)     Temp Source 12/16/23 0655 Oral     SpO2 12/16/23 0655 98 %     Weight 12/16/23 0647 284 lb (128.8 kg)     Height 12/16/23 0647 5\' 8"  (1.727 m)     Head Circumference --      Peak Flow --      Pain Score 12/16/23 0647 6     Pain Loc --      Pain Education --      Exclude from Growth Chart --    Most recent vital signs: Vitals:   12/16/23 1100 12/16/23 1151  BP: 110/70   Pulse: 76   Resp:  16  Temp:    SpO2: 100%    General: Awake, oriented x4. CV:  Good peripheral perfusion.  Resp:  Normal effort.  Abd:  Distended with fluid wave, nontender Other:  Middle-aged overweight Caucasian male resting comfortably in no acute distress ED Results / Procedures / Treatments  Labs (all labs ordered are listed, but only  abnormal results are displayed) Labs Reviewed  CBC WITH DIFFERENTIAL/PLATELET - Abnormal; Notable for the following components:      Result Value   RBC 3.60 (*)    HCT 36.8 (*)    MCV 102.2 (*)    MCH 36.7 (*)    Platelets 88 (*)    Monocytes Absolute 1.2 (*)    Abs Immature Granulocytes 0.14 (*)    All other components within normal limits  COMPREHENSIVE METABOLIC PANEL WITH GFR - Abnormal; Notable for the following components:   Sodium 128 (*)    Chloride 94 (*)    Glucose, Bld 142 (*)    Calcium 7.9 (*)    Albumin  2.1 (*)    AST 92 (*)    ALT 50 (*)    Total Bilirubin 3.0 (*)    All other components within normal limits  AMMONIA - Abnormal; Notable for the following components:   Ammonia 81 (*)    All other components within normal limits  URINALYSIS, ROUTINE W REFLEX MICROSCOPIC - Abnormal; Notable for the following components:   Color, Urine YELLOW (*)    APPearance CLEAR (*)    Specific Gravity, Urine 1.004 (*)    Leukocytes,Ua SMALL (*)  All other components within normal limits  LIPASE, BLOOD  ETHANOL   EKG ED ECG REPORT I, Charleen Conn, the attending physician, personally viewed and interpreted this ECG. Date: 12/16/2023 EKG Time: 0713 Rate: 86 Rhythm: normal sinus rhythm QRS Axis: normal Intervals: normal ST/T Wave abnormalities: normal Narrative Interpretation: no evidence of acute ischemia PROCEDURES: Critical Care performed: No Procedures MEDICATIONS ORDERED IN ED: Medications  lidocaine  (PF) (XYLOCAINE ) 1 % injection 10 mL (10 mLs Intradermal Given 12/16/23 1124)   IMPRESSION / MDM / ASSESSMENT AND PLAN / ED COURSE  I reviewed the triage vital signs and the nursing notes.                             The patient is on the cardiac monitor to evaluate for evidence of arrhythmia and/or significant heart rate changes. Patient's presentation is most consistent with acute presentation with potential threat to life or bodily function. 58 year old  male presents for increasing abdominal distention secondary to his alcoholic cirrhosis related ascites ascites ED Workup: I have spoken to interventional radiology who agrees for IR drainage of his ascites today  Given patient's history, exam there presentation is most consistent with abdominal pain from ascites volume burden. I have a low suspicion for Spontaneous Bacterial Peritonitis or other abdominal emergency at this time. The patient had a significant amount of ascites in the abdominal cavity necessitating paracentesis.  ED Interventions: Paracentesis  Disposition: Discharge. Instructed regarding follow up within 24-48 hours with a primary care practitioner and strict return precautions.   FINAL CLINICAL IMPRESSION(S) / ED DIAGNOSES   Final diagnoses:  Ascites due to alcoholic cirrhosis (HCC)   Rx / DC Orders   ED Discharge Orders     None      Note:  This document was prepared using Dragon voice recognition software and may include unintentional dictation errors.   Charleen Conn, MD 12/17/23 Trenia Fritter

## 2024-02-01 DEATH — deceased
